# Patient Record
Sex: Male | Born: 1940 | Race: Black or African American | Hispanic: No | Marital: Married | Smoking: Never smoker
Health system: Southern US, Community
[De-identification: ages and names within clinical notes are randomized; demographics above are authoritative.]

## PROBLEM LIST (undated history)

## (undated) DIAGNOSIS — I10 Essential (primary) hypertension: Secondary | ICD-10-CM

## (undated) DIAGNOSIS — I4891 Unspecified atrial fibrillation: Secondary | ICD-10-CM

## (undated) DIAGNOSIS — N400 Enlarged prostate without lower urinary tract symptoms: Secondary | ICD-10-CM

## (undated) DIAGNOSIS — G20 Parkinson's disease: Secondary | ICD-10-CM

## (undated) DIAGNOSIS — E785 Hyperlipidemia, unspecified: Secondary | ICD-10-CM

## (undated) DIAGNOSIS — R001 Bradycardia, unspecified: Secondary | ICD-10-CM

## (undated) DIAGNOSIS — G20A1 Parkinson's disease without dyskinesia, without mention of fluctuations: Secondary | ICD-10-CM

## (undated) DIAGNOSIS — R413 Other amnesia: Secondary | ICD-10-CM

## (undated) DIAGNOSIS — F0390 Unspecified dementia without behavioral disturbance: Secondary | ICD-10-CM

## (undated) DIAGNOSIS — I959 Hypotension, unspecified: Secondary | ICD-10-CM

## (undated) HISTORY — PX: PACEMAKER PLACEMENT: SHX43

---

## 2007-10-17 ENCOUNTER — Emergency Department (HOSPITAL_COMMUNITY): Admission: EM | Admit: 2007-10-17 | Discharge: 2007-10-17 | Payer: Self-pay | Admitting: Emergency Medicine

## 2011-10-18 ENCOUNTER — Emergency Department (HOSPITAL_COMMUNITY): Payer: Medicare Other

## 2011-10-18 ENCOUNTER — Other Ambulatory Visit: Payer: Self-pay

## 2011-10-18 ENCOUNTER — Encounter (HOSPITAL_COMMUNITY): Payer: Self-pay | Admitting: Emergency Medicine

## 2011-10-18 ENCOUNTER — Emergency Department (HOSPITAL_COMMUNITY)
Admission: EM | Admit: 2011-10-18 | Discharge: 2011-10-18 | Disposition: A | Discharge status: Home / Self Care | Payer: Medicare Other | Attending: Emergency Medicine | Admitting: Emergency Medicine

## 2011-10-18 DIAGNOSIS — R109 Unspecified abdominal pain: Secondary | ICD-10-CM | POA: Insufficient documentation

## 2011-10-18 DIAGNOSIS — M545 Low back pain, unspecified: Secondary | ICD-10-CM | POA: Insufficient documentation

## 2011-10-18 DIAGNOSIS — R42 Dizziness and giddiness: Secondary | ICD-10-CM | POA: Insufficient documentation

## 2011-10-18 DIAGNOSIS — Z79899 Other long term (current) drug therapy: Secondary | ICD-10-CM | POA: Insufficient documentation

## 2011-10-18 DIAGNOSIS — E785 Hyperlipidemia, unspecified: Secondary | ICD-10-CM | POA: Insufficient documentation

## 2011-10-18 DIAGNOSIS — R05 Cough: Secondary | ICD-10-CM | POA: Insufficient documentation

## 2011-10-18 DIAGNOSIS — R5381 Other malaise: Secondary | ICD-10-CM | POA: Insufficient documentation

## 2011-10-18 DIAGNOSIS — R059 Cough, unspecified: Secondary | ICD-10-CM | POA: Insufficient documentation

## 2011-10-18 DIAGNOSIS — R531 Weakness: Secondary | ICD-10-CM

## 2011-10-18 DIAGNOSIS — I10 Essential (primary) hypertension: Secondary | ICD-10-CM | POA: Insufficient documentation

## 2011-10-18 HISTORY — DX: Hyperlipidemia, unspecified: E78.5

## 2011-10-18 HISTORY — DX: Benign prostatic hyperplasia without lower urinary tract symptoms: N40.0

## 2011-10-18 HISTORY — DX: Essential (primary) hypertension: I10

## 2011-10-18 LAB — CBC WITH DIFFERENTIAL/PLATELET
Basophils Absolute: 0 10*3/uL (ref 0.0–0.1)
Basophils Relative: 0 % (ref 0–1)
Eosinophils Absolute: 0.1 10*3/uL (ref 0.0–0.7)
Eosinophils Relative: 2 % (ref 0–5)
HCT: 37.5 % — ABNORMAL LOW (ref 39.0–52.0)
MCH: 24.8 pg — ABNORMAL LOW (ref 26.0–34.0)
MCHC: 31.2 g/dL (ref 30.0–36.0)
MCV: 79.4 fL (ref 78.0–100.0)
Monocytes Absolute: 0.5 10*3/uL (ref 0.1–1.0)
Monocytes Relative: 7 % (ref 3–12)
RDW: 13 % (ref 11.5–15.5)

## 2011-10-18 LAB — URINALYSIS, ROUTINE W REFLEX MICROSCOPIC
Bilirubin Urine: NEGATIVE
Glucose, UA: NEGATIVE mg/dL
Protein, ur: NEGATIVE mg/dL
Urobilinogen, UA: 0.2 mg/dL (ref 0.0–1.0)

## 2011-10-18 LAB — BASIC METABOLIC PANEL
BUN: 8 mg/dL (ref 6–23)
CO2: 29 mEq/L (ref 19–32)
Chloride: 102 mEq/L (ref 96–112)
Creatinine, Ser: 1.06 mg/dL (ref 0.50–1.35)
GFR calc Af Amer: 78 mL/min — ABNORMAL LOW (ref >90)
Glucose, Bld: 101 mg/dL — ABNORMAL HIGH (ref 70–99)

## 2011-10-18 LAB — URINE MICROSCOPIC-ADD ON

## 2011-10-18 MED ORDER — NAPROXEN 250 MG PO TABS
250.0000 mg | ORAL_TABLET | Freq: Once | ORAL | Status: AC
  Administered 2011-10-18: 250 mg via ORAL
  Filled 2011-10-18: qty 1

## 2011-10-18 MED ORDER — SODIUM CHLORIDE 0.9 % IV SOLN
INTRAVENOUS | Status: DC
  Administered 2011-10-18: 22:00:00 via INTRAVENOUS

## 2011-10-18 MED ORDER — POTASSIUM CHLORIDE 20 MEQ/15ML (10%) PO LIQD
40.0000 meq | Freq: Once | ORAL | Status: AC
  Administered 2011-10-18: 40 meq via ORAL
  Filled 2011-10-18: qty 30

## 2011-10-18 NOTE — ED Provider Notes (Signed)
History     CSN: [id number]  Arrival date & time 10/18/11  4098   First MD Initiated Contact with Patient 10/18/11 1819      Chief Complaint  Patient presents with  . Weakness  . Dizziness     HPI Pt was seen at 1905.  Per pt, c/o gradual onset and persistence of constant generalized weakness and fatigue and lightheadedness for the past 3 days.  States his symptoms began after he had his colonoscopy.  Pt also c/o ongoing right flank pain radiating into right lower abd "for a while now."  Pt states he has been eval by his PMD for same and was "told to take naprosyn," but does not remember the diagnosis.  Denies N/V/D, no vertigo, no syncope, no CP/palpitations, no SOB/cough, no visual changes, no facial droop, no slurred speech, no focal motor weakness, no tingling/numbness in extremities, no fevers, no black or blood in stools, no dysuria/hematuria, no testicular pain/swelling.     Past Medical History  Diagnosis Date  . BPH (benign prostatic hyperplasia)   . Hypertension   . Hyperlipemia     History reviewed. No pertinent past surgical history.   History  Substance Use Topics  . Smoking status: Not on file  . Smokeless tobacco: Not on file  . Alcohol Use: No    Review of Systems ROS: Statement: All systems negative except as marked or noted in the HPI; Constitutional: +generalized weakness/fatigue. Negative for fever and chills. ; ; Eyes: Negative for eye pain, redness and discharge. ; ; ENMT: Negative for ear pain, hoarseness, nasal congestion, sinus pressure and sore throat. ; ; Cardiovascular: Negative for chest pain, palpitations, diaphoresis, dyspnea and peripheral edema. ; ; Respiratory: Negative for cough, wheezing and stridor. ; ; Gastrointestinal: Negative for nausea, vomiting, diarrhea, abdominal pain, blood in stool, hematemesis, jaundice and rectal bleeding. . ; ; Genitourinary: Negative for dysuria, flank pain and hematuria. ; ; Genital:  No penile drainage or rash,  no testicular pain or swelling, no scrotal rash or swelling. ;; Musculoskeletal: +LBP. Negative for neck pain. Negative for swelling and trauma.; ; Skin: Negative for pruritus, rash, abrasions, blisters, bruising and skin lesion.; ; Neuro: +lightheadedness. Negative for headache and neck stiffness. Negative for weakness, altered level of consciousness , altered mental status, extremity weakness, paresthesias, involuntary movement, seizure and syncope.     Allergies  Review of patient's allergies indicates no known allergies.  Home Medications   Current Outpatient Rx  Name Route Sig Dispense Refill  . AMLODIPINE BESYLATE 5 MG PO TABS Oral Take 5 mg by mouth daily.    . ASPIRIN EC 81 MG PO TBEC Oral Take 81 mg by mouth daily.    Marland Kitchen DOXAZOSIN MESYLATE 8 MG PO TABS Oral Take 8 mg by mouth at bedtime.    Marland Kitchen FERROUS SULFATE 325 (65 FE) MG PO TABS Oral Take 325 mg by mouth daily with breakfast.    . HYDROCODONE-ACETAMINOPHEN 5-325 MG PO TABS Oral Take 1 tablet by mouth every 4 (four) hours as needed. pain    . LORATADINE 10 MG PO TABS Oral Take 10 mg by mouth daily.    Marland Kitchen LORAZEPAM 0.5 MG PO TABS Oral Take 0.5 mg by mouth daily as needed. anxiety    . SIMVASTATIN 20 MG PO TABS Oral Take 20 mg by mouth every evening.    . TORSEMIDE 10 MG PO TABS Oral Take 10 mg by mouth daily.      BP 158/72  Pulse 54  Temp 98.1 F (36.7 C) (Oral)  Resp 20  Ht 5\' 9"  (1.753 m)  Wt 250 lb (113.399 kg)  BMI 36.92 kg/m2  SpO2 98%  Physical Exam 1905: Physical examination:  Nursing notes reviewed; Vital signs and O2 SAT reviewed;  Constitutional: Well developed, Well nourished, Well hydrated, In no acute distress; Head:  Normocephalic, atraumatic; Eyes: EOMI, PERRL, No scleral icterus; ENMT: Mouth and pharynx normal, Mucous membranes moist; Neck: Supple, Full range of motion, No lymphadenopathy; Cardiovascular: Regular rate and rhythm, No gallop; Respiratory: Breath sounds clear & equal bilaterally, No wheezes.   Speaking full sentences with ease, Normal respiratory effort/excursion; Chest: Nontender, Movement normal; Abdomen: Soft, Nontender, No palp masses. Nondistended, Normal bowel sounds; Genitourinary: No CVA tenderness; Spine:  No midline CS, TS, LS tenderness.;; Extremities: Pulses normal, No tenderness, No edema, No calf edema or asymmetry.; Neuro: AA&Ox3, Major CN grossly intact.  Speech clear. No facial droop. No gross focal motor or sensory deficits in extremities.; Skin: Color normal, Warm, Dry.   ED Course  Procedures   1910:  Pt is not orthostatic.    MDM  MDM Reviewed: nursing note and vitals Interpretation: labs, ECG, x-ray and CT scan    Date: 10/18/2011  Rate: 44  Rhythm: normal sinus rhythm and sinus arrhythmia  QRS Axis: normal  Intervals: PR prolonged  ST/T Wave abnormalities: normal  Conduction Disutrbances:first-degree A-V block   Narrative Interpretation:   Old EKG Reviewed: none available.  Results for orders placed during the hospital encounter of 10/18/11  BASIC METABOLIC PANEL      Component Value Range   Sodium 139  135 - 145 mEq/L   Potassium 3.1 (*) 3.5 - 5.1 mEq/L   Chloride 102  96 - 112 mEq/L   CO2 29  19 - 32 mEq/L   Glucose, Bld 101 (*) 70 - 99 mg/dL   BUN 8  6 - 23 mg/dL   Creatinine, Ser 1.61  0.50 - 1.35 mg/dL   Calcium 9.5  8.4 - 09.6 mg/dL   GFR calc non Af Amer 68 (*) >90 mL/min   GFR calc Af Amer 78 (*) >90 mL/min  CBC WITH DIFFERENTIAL      Component Value Range   WBC 7.6  4.0 - 10.5 K/uL   RBC 4.72  4.22 - 5.81 MIL/uL   Hemoglobin 11.7 (*) 13.0 - 17.0 g/dL   HCT 04.5 (*) 40.9 - 81.1 %   MCV 79.4  78.0 - 100.0 fL   MCH 24.8 (*) 26.0 - 34.0 pg   MCHC 31.2  30.0 - 36.0 g/dL   RDW 91.4  78.2 - 95.6 %   Platelets 203  150 - 400 K/uL   Neutrophils Relative 67  43 - 77 %   Neutro Abs 5.1  1.7 - 7.7 K/uL   Lymphocytes Relative 24  12 - 46 %   Lymphs Abs 1.8  0.7 - 4.0 K/uL   Monocytes Relative 7  3 - 12 %   Monocytes Absolute 0.5  0.1  - 1.0 K/uL   Eosinophils Relative 2  0 - 5 %   Eosinophils Absolute 0.1  0.0 - 0.7 K/uL   Basophils Relative 0  0 - 1 %   Basophils Absolute 0.0  0.0 - 0.1 K/uL  TROPONIN I      Component Value Range   Troponin I <0.30  <0.30 ng/mL  URINALYSIS, ROUTINE W REFLEX MICROSCOPIC      Component Value Range   Color, Urine YELLOW  YELLOW   APPearance CLEAR  CLEAR   Specific Gravity, Urine 1.015  1.005 - 1.030   pH 7.5  5.0 - 8.0   Glucose, UA NEGATIVE  NEGATIVE mg/dL   Hgb urine dipstick TRACE (*) NEGATIVE   Bilirubin Urine NEGATIVE  NEGATIVE   Ketones, ur NEGATIVE  NEGATIVE mg/dL   Protein, ur NEGATIVE  NEGATIVE mg/dL   Urobilinogen, UA 0.2  0.0 - 1.0 mg/dL   Nitrite NEGATIVE  NEGATIVE   Leukocytes, UA NEGATIVE  NEGATIVE  URINE MICROSCOPIC-ADD ON      Component Value Range   Squamous Epithelial / LPF RARE  RARE   WBC, UA 0-2  <3 WBC/hpf   RBC / HPF 3-6  <3 RBC/hpf   Bacteria, UA RARE  RARE   Dg Chest 1 View 10/18/2011  *RADIOLOGY REPORT*  Clinical Data: Abnormal chest x-ray  CHEST - 1 VIEW  Comparison: Film from earlier in the same day  Findings: Frontal view the chest was obtained with nipple markers in place.  Previously seen density does not represent a nipple shadow but likely represents calcification in an anterior rib end. No other focal abnormality is noted.  IMPRESSION: The previously seen nodule does not represent a nipple shadow.  It does however likely represent calcification in the anterior aspect of the rib end on the right.  The need for confirmation by means of CT examination can be determined on a clinical basis.  The scheduled CT of the abdomen and pelvis likely cover this region.   Original Report Authenticated By: Phillips Odor, M.D.    Dg Chest 2 View 10/18/2011  *RADIOLOGY REPORT*  Clinical Data: 71 year old male with cough and weakness.  CHEST - 2 VIEW  Comparison: None.  Findings: Normal slightly shallow lung volumes.  Cardiac size and mediastinal contours are within  normal limits.  Visualized tracheal air column is within normal limits.  No pneumothorax, pulmonary edema, pleural effusion or confluent pulmonary opacity.  4 mm nodular density projects at the anterior right sixth rib level. This might represent a nipple shadow. No acute osseous abnormality identified.  Flowing osteophytes or syndesmophytes in the thoracic spine.  IMPRESSION: 1.  4 mm right lung base lung nodule versus nipple shadow.  Repeat PA view with nipple markers may confirm. 2.  Otherwise no acute cardiopulmonary abnormality.   Original Report Authenticated By: Harley Hallmark, M.D.    Ct Abdomen Pelvis Wo Contrast 10/18/2011  *RADIOLOGY REPORT*  Clinical Data: Right-sided flank pain.  CT ABDOMEN AND PELVIS WITHOUT CONTRAST  Technique:  Multidetector CT imaging of the abdomen and pelvis was performed following the standard protocol without intravenous contrast.  Comparison: None.  Findings: No evidence of renal calculi or hydronephrosis.  No evidence of ureteral calculi or dilatation.  No bladder calculi identified.  Mild to moderate enlargement prostate gland is seen however the bladder is nondilated.  The other abdominal parenchymal organs have a normal appearance on this noncontrast study.  Gallbladder is unremarkable.  No soft tissue masses or lymphadenopathy identified.  No evidence of inflammatory process or abnormal fluid collections.  Normal appendix is visualized.  No evidence of dilated bowel loops.  IMPRESSION:  1.  No evidence of urolithiasis, hydronephrosis, or other acute findings. 2.  Mild to moderate prostate enlargement.   Original Report Authenticated By: Danae Orleans, M.D.       2200:  Spoke with Rads MD Eppie Gibson regarding correlating CXR findings with CT scan:  CT scan did cover the area in  question on the CXR, but no corresponding abnormality is noted on the CT.   Pt's VS remain stable.  Pt has tol PO well without N/V.  Pt has ambulated in the ED with steady gait, easy resps.   Potassium repleted PO. States he is ready to go home now.  Dx testing d/w pt and family.  Questions answered.  Verb understanding, agreeable to d/c home with outpt f/u.       Laray Anger, DO 10/22/11 1707

## 2011-10-18 NOTE — ED Notes (Signed)
Pt ambulated to bathroom.  No signs of distress.

## 2011-10-18 NOTE — ED Notes (Signed)
Pt reports that having a colonoscopy done last Wednesday at [facility],  Pt reports that he has been dizzy and weak since the procedure, dizziness is worse with movement, wife reports that pt has not been eating well since Wednesday, pt also c/o lower abd pain that pt states that he has "off and on" since before the procedure.

## 2011-10-18 NOTE — ED Notes (Signed)
Patient was asked to get a urine sample. Patient stated he would try.

## 2011-10-18 NOTE — ED Notes (Signed)
Pt c/o weakness and dizziness since his colonoscopy Wed.

## 2011-10-21 LAB — URINE CULTURE
Colony Count: NO GROWTH
Culture: NO GROWTH

## 2015-05-17 ENCOUNTER — Emergency Department (HOSPITAL_COMMUNITY): Payer: Medicare Other

## 2015-05-17 ENCOUNTER — Encounter (HOSPITAL_COMMUNITY): Payer: Self-pay | Admitting: Emergency Medicine

## 2015-05-17 ENCOUNTER — Emergency Department (HOSPITAL_COMMUNITY)
Admission: EM | Admit: 2015-05-17 | Discharge: 2015-05-17 | Disposition: A | Discharge status: Home / Self Care | Payer: Medicare Other | Attending: Emergency Medicine | Admitting: Emergency Medicine

## 2015-05-17 DIAGNOSIS — R0789 Other chest pain: Secondary | ICD-10-CM | POA: Insufficient documentation

## 2015-05-17 DIAGNOSIS — Z7982 Long term (current) use of aspirin: Secondary | ICD-10-CM | POA: Insufficient documentation

## 2015-05-17 DIAGNOSIS — Z7951 Long term (current) use of inhaled steroids: Secondary | ICD-10-CM | POA: Insufficient documentation

## 2015-05-17 DIAGNOSIS — Z79899 Other long term (current) drug therapy: Secondary | ICD-10-CM | POA: Insufficient documentation

## 2015-05-17 DIAGNOSIS — I10 Essential (primary) hypertension: Secondary | ICD-10-CM | POA: Diagnosis not present

## 2015-05-17 HISTORY — DX: Hypotension, unspecified: I95.9

## 2015-05-17 LAB — BASIC METABOLIC PANEL
Anion gap: 6 (ref 5–15)
BUN: 9 mg/dL (ref 6–20)
CALCIUM: 9 mg/dL (ref 8.9–10.3)
CO2: 29 mmol/L (ref 22–32)
CREATININE: 1.11 mg/dL (ref 0.61–1.24)
Chloride: 106 mmol/L (ref 101–111)
GFR calc Af Amer: >60 mL/min (ref >60)
GLUCOSE: 106 mg/dL — AB (ref 65–99)
POTASSIUM: 4.7 mmol/L (ref 3.5–5.1)
SODIUM: 141 mmol/L (ref 135–145)

## 2015-05-17 LAB — CBC
HCT: 41 % (ref 39.0–52.0)
Hemoglobin: 13 g/dL (ref 13.0–17.0)
MCH: 25.8 pg — ABNORMAL LOW (ref 26.0–34.0)
MCHC: 31.7 g/dL (ref 30.0–36.0)
MCV: 81.3 fL (ref 78.0–100.0)
PLATELETS: 215 10*3/uL (ref 150–400)
RBC: 5.04 MIL/uL (ref 4.22–5.81)
RDW: 13 % (ref 11.5–15.5)
WBC: 6.6 10*3/uL (ref 4.0–10.5)

## 2015-05-17 LAB — TROPONIN I: Troponin I: <0.03 ng/mL (ref <0.031)

## 2015-05-17 NOTE — ED Provider Notes (Signed)
CSN: [id number]     Arrival date & time 05/17/15  1128 History   First MD Initiated Contact with Patient 05/17/15 1243     Chief Complaint  Patient presents with  . Chest Pain     (Consider location/radiation/quality/duration/timing/severity/associated sxs/prior Treatment) Patient is a 75 y.o. male presenting with chest pain. The history is provided by the patient and the spouse (According to the patient's wife the patient was hitting himself in the chest limits having a dream last night. He complains of left-sided chest pain where he spent striking himself).  Chest Pain Pain location:  L chest Pain quality: aching   Pain radiates to:  Does not radiate Pain radiates to the back: no   Pain severity:  Mild Onset quality:  Sudden Timing:  Intermittent Progression:  Waxing and waning Associated symptoms: no abdominal pain, no back pain, no cough, no fatigue and no headache     Past Medical History  Diagnosis Date  . BPH (benign prostatic hyperplasia)   . Hypertension   . Hyperlipemia   . Hypotension    Past Surgical History  Procedure Laterality Date  . Pacemaker placement     No family history on file. Social History  Substance Use Topics  . Smoking status: Never Smoker   . Smokeless tobacco: None  . Alcohol Use: No    Review of Systems  Constitutional: Negative for appetite change and fatigue.  HENT: Negative for congestion, ear discharge and sinus pressure.   Eyes: Negative for discharge.  Respiratory: Negative for cough.   Cardiovascular: Positive for chest pain.  Gastrointestinal: Negative for abdominal pain and diarrhea.  Genitourinary: Negative for frequency and hematuria.  Musculoskeletal: Negative for back pain.  Skin: Negative for rash.  Neurological: Negative for seizures and headaches.  Psychiatric/Behavioral: Negative for hallucinations.      Allergies  Review of patient's allergies indicates no known allergies.  Home Medications   Prior to  Admission medications   Medication Sig Start Date End Date Taking? Authorizing Provider  aspirin EC 81 MG tablet Take 81 mg by mouth daily.   Yes Historical Provider, MD  fluticasone Aleda Grana(FLONASE) 50 MCG/ACT nasal spray  07/29/10  Yes Historical Provider, MD  amLODipine (NORVASC) 5 MG tablet Take 5 mg by mouth daily.    Historical Provider, MD  doxazosin (CARDURA) 8 MG tablet Take 8 mg by mouth at bedtime.    Historical Provider, MD  ferrous sulfate 325 (65 FE) MG tablet Take 325 mg by mouth daily with breakfast.    Historical Provider, MD  HYDROcodone-acetaminophen (NORCO/VICODIN) 5-325 MG per tablet Take 1 tablet by mouth every 4 (four) hours as needed. pain    Historical Provider, MD  loratadine (CLARITIN) 10 MG tablet Take 10 mg by mouth daily.    Historical Provider, MD  LORazepam (ATIVAN) 0.5 MG tablet Take 0.5 mg by mouth daily as needed. anxiety    Historical Provider, MD  simvastatin (ZOCOR) 20 MG tablet Take 20 mg by mouth every evening.    Historical Provider, MD  torsemide (DEMADEX) 10 MG tablet Take 10 mg by mouth daily.    Historical Provider, MD   BP 145/74 mmHg  Pulse 60  Temp(Src) 97.6 F (36.4 C) (Oral)  Resp 16  Ht 5\' 11"  (1.803 m)  Wt 246 lb (111.585 kg)  BMI 34.33 kg/m2  SpO2 100% Physical Exam  Constitutional: He is oriented to person, place, and time. He appears well-developed.  HENT:  Head: Normocephalic.  Eyes: Conjunctivae and EOM are  normal. No scleral icterus.  Neck: Neck supple. No thyromegaly present.  Cardiovascular: Normal rate and regular rhythm.  Exam reveals no gallop and no friction rub.   No murmur heard. Tender left side chest mild  Pulmonary/Chest: No stridor. He has no wheezes. He has no rales. He exhibits no tenderness.  Abdominal: He exhibits no distension. There is no tenderness. There is no rebound.  Musculoskeletal: Normal range of motion. He exhibits no edema.  Lymphadenopathy:    He has no cervical adenopathy.  Neurological: He is oriented  to person, place, and time. He exhibits normal muscle tone. Coordination normal.  Skin: No rash noted. No erythema.  Psychiatric: He has a normal mood and affect. His behavior is normal.    ED Course  Procedures (including critical care time) Labs Review Labs Reviewed  BASIC METABOLIC PANEL - Abnormal; Notable for the following:    Glucose, Bld 106 (*)    All other components within normal limits  CBC - Abnormal; Notable for the following:    MCH 25.8 (*)    All other components within normal limits  TROPONIN I  TROPONIN I    Imaging Review Dg Chest 2 View  05/17/2015  CLINICAL DATA:  Central chest pain for 1 day EXAM: CHEST  2 VIEW COMPARISON:  10/18/2011 FINDINGS: Cardiac shadow is stable. A pacing device is now seen. The lungs are clear bilaterally. Mild degenerative changes of thoracic spine are noted. IMPRESSION: No active cardiopulmonary disease. Electronically Signed   By: Alcide Clever M.D.   On: 05/17/2015 12:13   I have personally reviewed and evaluated these images and lab results as part of my medical decision-making.   EKG Interpretation   Date/Time:  Thursday May 17 2015 11:38:34 EDT Ventricular Rate:  66 PR Interval:  256 QRS Duration: 78 QT Interval:  394 QTC Calculation: 413 R Axis:   55 Text Interpretation:  Atrial-paced rhythm with prolonged AV conduction  Anteroseptal infarct , age undetermined Abnormal ECG Confirmed by Dajiah Kooi   MD, Toneisha Savary [phone]) on 05/17/2015 1:17:02 PM      MDM   Final diagnoses:  Anterior chest wall pain    Labs including 2 troponins were negative. EKG unremarkable. Patient will be discharged home with diagnosis of chest wall tenderness. He'll follow-up with his PCP as needed    Bethann Berkshire, MD 05/17/15 1643

## 2015-05-17 NOTE — ED Notes (Signed)
MD at bedside. 

## 2015-05-17 NOTE — ED Notes (Signed)
Pt reports sudden onset of central CP that began this morning. Pt reports after CP began he felt dizzy. State CP has resolved at this time, but continues to feel dizzy. Pt has a pacemaker.

## 2015-05-17 NOTE — ED Notes (Signed)
Pt denies any CP at this time. Pt states he is dizzy but this has been going on for months.

## 2015-05-17 NOTE — Discharge Instructions (Signed)
Follow up with your md if problems °

## 2016-06-03 ENCOUNTER — Other Ambulatory Visit (HOSPITAL_COMMUNITY): Payer: Self-pay | Admitting: Neurology

## 2016-06-03 DIAGNOSIS — R413 Other amnesia: Secondary | ICD-10-CM

## 2016-06-17 ENCOUNTER — Ambulatory Visit (HOSPITAL_COMMUNITY)
Admission: RE | Admit: 2016-06-17 | Discharge: 2016-06-17 | Disposition: A | Discharge status: Home / Self Care | Payer: Medicare Other | Source: Ambulatory Visit | Attending: Neurology | Admitting: Neurology

## 2016-06-17 DIAGNOSIS — F0390 Unspecified dementia without behavioral disturbance: Secondary | ICD-10-CM | POA: Diagnosis present

## 2016-06-17 DIAGNOSIS — G319 Degenerative disease of nervous system, unspecified: Secondary | ICD-10-CM | POA: Insufficient documentation

## 2016-06-17 DIAGNOSIS — R413 Other amnesia: Secondary | ICD-10-CM

## 2018-08-10 ENCOUNTER — Other Ambulatory Visit: Payer: Self-pay | Admitting: Neurology

## 2018-08-10 ENCOUNTER — Other Ambulatory Visit (HOSPITAL_COMMUNITY): Payer: Self-pay | Admitting: Neurology

## 2018-08-10 DIAGNOSIS — G4452 New daily persistent headache (NDPH): Secondary | ICD-10-CM

## 2018-08-25 ENCOUNTER — Ambulatory Visit (HOSPITAL_COMMUNITY): Payer: Medicare Other

## 2018-09-10 ENCOUNTER — Other Ambulatory Visit: Payer: Self-pay

## 2018-09-10 ENCOUNTER — Ambulatory Visit (HOSPITAL_COMMUNITY)
Admission: RE | Admit: 2018-09-10 | Discharge: 2018-09-10 | Disposition: A | Discharge status: Home / Self Care | Payer: Medicare Other | Source: Ambulatory Visit | Attending: Neurology | Admitting: Neurology

## 2018-09-10 DIAGNOSIS — G4452 New daily persistent headache (NDPH): Secondary | ICD-10-CM | POA: Insufficient documentation

## 2018-10-29 ENCOUNTER — Emergency Department (HOSPITAL_COMMUNITY): Payer: Medicare Other

## 2018-10-29 ENCOUNTER — Other Ambulatory Visit: Payer: Self-pay

## 2018-10-29 ENCOUNTER — Encounter (HOSPITAL_COMMUNITY): Payer: Self-pay

## 2018-10-29 ENCOUNTER — Inpatient Hospital Stay (HOSPITAL_COMMUNITY)
Admission: EM | Admit: 2018-10-29 | Discharge: 2018-11-01 | DRG: 948 | Disposition: A | Discharge status: Skilled Nursing Facility | Payer: Medicare Other | Attending: Internal Medicine | Admitting: Internal Medicine

## 2018-10-29 DIAGNOSIS — R001 Bradycardia, unspecified: Secondary | ICD-10-CM | POA: Diagnosis not present

## 2018-10-29 DIAGNOSIS — F0390 Unspecified dementia without behavioral disturbance: Secondary | ICD-10-CM | POA: Diagnosis not present

## 2018-10-29 DIAGNOSIS — I495 Sick sinus syndrome: Secondary | ICD-10-CM | POA: Diagnosis present

## 2018-10-29 DIAGNOSIS — G20A1 Parkinson's disease without dyskinesia, without mention of fluctuations: Secondary | ICD-10-CM

## 2018-10-29 DIAGNOSIS — G20 Parkinson's disease: Secondary | ICD-10-CM | POA: Diagnosis present

## 2018-10-29 DIAGNOSIS — Z950 Presence of cardiac pacemaker: Secondary | ICD-10-CM

## 2018-10-29 DIAGNOSIS — R5381 Other malaise: Secondary | ICD-10-CM | POA: Diagnosis not present

## 2018-10-29 DIAGNOSIS — E039 Hypothyroidism, unspecified: Secondary | ICD-10-CM | POA: Diagnosis present

## 2018-10-29 DIAGNOSIS — R531 Weakness: Principal | ICD-10-CM | POA: Diagnosis present

## 2018-10-29 DIAGNOSIS — I482 Chronic atrial fibrillation, unspecified: Secondary | ICD-10-CM | POA: Diagnosis present

## 2018-10-29 DIAGNOSIS — N400 Enlarged prostate without lower urinary tract symptoms: Secondary | ICD-10-CM | POA: Diagnosis present

## 2018-10-29 DIAGNOSIS — Z7982 Long term (current) use of aspirin: Secondary | ICD-10-CM

## 2018-10-29 DIAGNOSIS — Z7901 Long term (current) use of anticoagulants: Secondary | ICD-10-CM

## 2018-10-29 DIAGNOSIS — I480 Paroxysmal atrial fibrillation: Secondary | ICD-10-CM | POA: Diagnosis present

## 2018-10-29 DIAGNOSIS — E785 Hyperlipidemia, unspecified: Secondary | ICD-10-CM | POA: Diagnosis present

## 2018-10-29 DIAGNOSIS — N179 Acute kidney failure, unspecified: Secondary | ICD-10-CM | POA: Diagnosis present

## 2018-10-29 DIAGNOSIS — Z79899 Other long term (current) drug therapy: Secondary | ICD-10-CM

## 2018-10-29 DIAGNOSIS — G309 Alzheimer's disease, unspecified: Secondary | ICD-10-CM | POA: Diagnosis present

## 2018-10-29 DIAGNOSIS — F0281 Dementia in other diseases classified elsewhere with behavioral disturbance: Secondary | ICD-10-CM | POA: Diagnosis present

## 2018-10-29 DIAGNOSIS — Z7983 Long term (current) use of bisphosphonates: Secondary | ICD-10-CM

## 2018-10-29 DIAGNOSIS — I10 Essential (primary) hypertension: Secondary | ICD-10-CM | POA: Diagnosis present

## 2018-10-29 DIAGNOSIS — Z20828 Contact with and (suspected) exposure to other viral communicable diseases: Secondary | ICD-10-CM | POA: Diagnosis present

## 2018-10-29 HISTORY — DX: Other amnesia: R41.3

## 2018-10-29 HISTORY — DX: Bradycardia, unspecified: R00.1

## 2018-10-29 LAB — CBC WITH DIFFERENTIAL/PLATELET
Abs Immature Granulocytes: 0.02 10*3/uL (ref 0.00–0.07)
Basophils Absolute: 0 10*3/uL (ref 0.0–0.1)
Basophils Relative: 0 %
Eosinophils Absolute: 0.1 10*3/uL (ref 0.0–0.5)
Eosinophils Relative: 1 %
HCT: 44.3 % (ref 39.0–52.0)
Hemoglobin: 13 g/dL (ref 13.0–17.0)
Immature Granulocytes: 0 %
Lymphocytes Relative: 15 %
Lymphs Abs: 1.4 10*3/uL (ref 0.7–4.0)
MCH: 24.4 pg — ABNORMAL LOW (ref 26.0–34.0)
MCHC: 29.3 g/dL — ABNORMAL LOW (ref 30.0–36.0)
MCV: 83.3 fL (ref 80.0–100.0)
Monocytes Absolute: 0.9 10*3/uL (ref 0.1–1.0)
Monocytes Relative: 10 %
Neutro Abs: 6.7 10*3/uL (ref 1.7–7.7)
Neutrophils Relative %: 74 %
Platelets: 223 10*3/uL (ref 150–400)
RBC: 5.32 MIL/uL (ref 4.22–5.81)
RDW: 13.8 % (ref 11.5–15.5)
WBC: 9.1 10*3/uL (ref 4.0–10.5)
nRBC: 0 % (ref 0.0–0.2)

## 2018-10-29 LAB — COMPREHENSIVE METABOLIC PANEL
ALT: 33 U/L (ref 0–44)
AST: 110 U/L — ABNORMAL HIGH (ref 15–41)
Albumin: 4 g/dL (ref 3.5–5.0)
Alkaline Phosphatase: 75 U/L (ref 38–126)
Anion gap: 8 (ref 5–15)
BUN: 17 mg/dL (ref 8–23)
CO2: 25 mmol/L (ref 22–32)
Calcium: 8.8 mg/dL — ABNORMAL LOW (ref 8.9–10.3)
Chloride: 103 mmol/L (ref 98–111)
Creatinine, Ser: 1.49 mg/dL — ABNORMAL HIGH (ref 0.61–1.24)
GFR calc Af Amer: 51 mL/min — ABNORMAL LOW (ref >60)
GFR calc non Af Amer: 44 mL/min — ABNORMAL LOW (ref >60)
Glucose, Bld: 85 mg/dL (ref 70–99)
Potassium: 3.8 mmol/L (ref 3.5–5.1)
Sodium: 136 mmol/L (ref 135–145)
Total Bilirubin: 0.6 mg/dL (ref 0.3–1.2)
Total Protein: 7.5 g/dL (ref 6.5–8.1)

## 2018-10-29 LAB — URINALYSIS, ROUTINE W REFLEX MICROSCOPIC
Bacteria, UA: NONE SEEN
Bilirubin Urine: NEGATIVE
Glucose, UA: NEGATIVE mg/dL
Ketones, ur: 5 mg/dL — AB
Leukocytes,Ua: NEGATIVE
Nitrite: NEGATIVE
Protein, ur: 30 mg/dL — AB
Specific Gravity, Urine: 1.025 (ref 1.005–1.030)
pH: 5 (ref 5.0–8.0)

## 2018-10-29 LAB — TROPONIN I (HIGH SENSITIVITY)
Troponin I (High Sensitivity): 13 ng/L (ref <18)
Troponin I (High Sensitivity): 12 ng/L (ref <18)

## 2018-10-29 LAB — MAGNESIUM: Magnesium: 2.3 mg/dL (ref 1.7–2.4)

## 2018-10-29 LAB — BRAIN NATRIURETIC PEPTIDE: B Natriuretic Peptide: 101 pg/mL — ABNORMAL HIGH (ref 0.0–100.0)

## 2018-10-29 MED ORDER — SODIUM CHLORIDE 0.9 % IV SOLN
INTRAVENOUS | Status: DC
  Administered 2018-10-29: 19:00:00 via INTRAVENOUS

## 2018-10-29 MED ORDER — POTASSIUM CHLORIDE CRYS ER 20 MEQ PO TBCR
40.0000 meq | EXTENDED_RELEASE_TABLET | Freq: Once | ORAL | Status: AC
  Administered 2018-10-29: 22:00:00 40 meq via ORAL
  Filled 2018-10-29: qty 2

## 2018-10-29 NOTE — ED Triage Notes (Signed)
EMS reports pts wife reports pt has had generalized weakness and increased confusion since Monday.  Also reports increased swelling in ankles.  Pt says wife reports pt usually ambulatory.

## 2018-10-29 NOTE — ED Notes (Signed)
Patient unable to stand.  Spouse stated that for last 2 weeks patient has been unable to get up and down without her assisting him.  Patient stating he felt "uneasy about standing".

## 2018-10-29 NOTE — H&P (Signed)
TRH H&P   Patient Demographics:    Charles Stanley, is a 78 y.o. male  MRN: [id number]   DOB - 11/21/1940  Admit Date - 10/29/2018  Outpatient Primary Stanley for the patient is Charles Stanley  Referring Stanley/NP/PA: Dr Charles Stanley  Patient coming from: Home  Chief Complaint  Patient presents with  . Weakness      HPI:    Charles Stanley  is a 78 y.o. male, with past medical history of bradycardia, status post permanent pacemaker, BPH, hyperlipidemia, hypertension, dementia, and recent diagnosis of Parkinson disease, patient was brought to ED by his wife secondary to progressive generalized weakness, wife reports patient was diagnosed with Parkinson, not started on any treatment, reports that he has been using a walker for few months now, but he has a progressive decline, recently he was not even able to stand up secondary to unsteady gait, patient himself denies any focal deficits or weakness, but report generalized weakness, he is poor historian, most of the history was obtained from his wife, as well patient has poor appetite, there is no slurred speech, focal deficits, visual problem, loss of consciousness, syncope or near syncope, no fever, no chills, no chest pain or shortness of breath. -In ED CT head with no acute findings, no significant labs abnormalities, he was noted to have some mild pitting edema otherwise nothing focal.    Review of systems:    In addition to the HPI above, No Fever-chills, No Headache, No changes with Vision or hearing, No problems swallowing food or Liquids, but patient is with very poor appetite No Chest pain, Cough or Shortness of Breath, No Abdominal pain, No Nausea or Vommitting, Bowel movements are regular, No Blood in stool or Urine, No dysuria, No new skin rashes or bruises, No new joints pains-aches,  Patient is with generalized  weakness, unsteady gait, progressive over few weeks, but no focal deficits No recent weight gain or loss, No polyuria, polydypsia or polyphagia, No significant Mental Stressors.  A full 10 point Review of Systems was done, except as stated above, all other Review of Systems were negative.   With Past History of the following :    Past Medical History:  Diagnosis Date  . BPH (benign prostatic hyperplasia)   . Bradycardia   . Hyperlipemia   . Hypertension   . Hypotension   . Memory impairment       Past Surgical History:  Procedure Laterality Date  . PACEMAKER PLACEMENT        Social History:     Social History   Tobacco Use  . Smoking status: Never Smoker  Substance Use Topics  . Alcohol use: No        Family History :   Family history was reviewed, no pertinent history  Home Medications:   Prior to Admission medications   Medication Sig Start Date End Date  Taking? Authorizing Provider  apixaban (ELIQUIS) 5 MG TABS tablet Take 1 tablet by mouth 2 (two) times daily. 03/15/18  Yes Provider, Historical, Stanley  aspirin EC 81 MG tablet Take 81 mg by mouth daily.   Yes Provider, Historical, Stanley  donepezil (ARICEPT) 10 MG tablet Take 1 tablet by mouth daily.   Yes Provider, Historical, Stanley  levothyroxine (SYNTHROID) 50 MCG tablet Take 1 tablet by mouth daily. 12/13/17  Yes Provider, Historical, Stanley  loratadine (CLARITIN) 10 MG tablet Take 10 mg by mouth daily.   Yes Provider, Historical, Stanley  LORazepam (ATIVAN) 0.5 MG tablet Take 0.5 mg by mouth daily as needed. anxiety   Yes Provider, Historical, Stanley  losartan (COZAAR) 100 MG tablet Take 1 tablet by mouth daily. 12/04/17  Yes Provider, Historical, Stanley  Memantine HCl-Donepezil HCl (NAMZARIC) 28-10 MG CP24 Take 1 capsule by mouth daily. 08/04/17  Yes Provider, Historical, Stanley  Potassium Chloride ER 20 MEQ TBCR Take 1 tablet by mouth 2 (two) times daily.   Yes Provider, Historical, Stanley  sertraline (ZOLOFT) 100 MG tablet Take 1 tablet  by mouth daily. 12/14/17  Yes Provider, Historical, Stanley  silodosin (RAPAFLO) 8 MG CAPS capsule Take 1 capsule by mouth daily. 11/30/17  Yes Provider, Historical, Stanley     Allergies:    No Known Allergies   Physical Exam:   Vitals  Blood pressure (!) 131/94, pulse (!) 59, temperature 98.1 F (36.7 C), temperature source Oral, resp. rate 14, weight 111 kg, SpO2 100 %.   1. General frail, elderly male, laying in bed in no apparent distress  2.  Patient is awake alert x3, but slow to respond, with some baseline dementia.  3. No F.N deficits, ALL C.Nerves Intact, overall no focal deficits could be appreciated, but he has profound generalized weakness, Sensation intact all 4 extremities, Plantars down going.  4. Ears and Eyes appear Normal, Conjunctivae clear, PERRLA. Moist Oral Mucosa.  5. Supple Neck, No JVD, No cervical lymphadenopathy appriciated, No Carotid Bruits.  6. Symmetrical Chest wall movement, Good air movement bilaterally, CTAB.  7. RRR, No Gallops, Rubs or Murmurs, No Parasternal Heave.  1 edema  8. Positive Bowel Sounds, Abdomen Soft, No tenderness, No organomegaly appriciated,No rebound -guarding or rigidity.  9.  No Cyanosis, Normal Skin Turgor, No Skin Rash or Bruise.  10. Good muscle tone,  joints appear normal , no effusions, patient with some tremors.  11. No Palpable Lymph Nodes in Neck or Axillae    Data Review:    CBC Recent Labs  Lab 10/29/18 1734  WBC 9.1  HGB 13.0  HCT 44.3  PLT 223  MCV 83.3  MCH 24.4*  MCHC 29.3*  RDW 13.8  LYMPHSABS 1.4  MONOABS 0.9  EOSABS 0.1  BASOSABS 0.0   ------------------------------------------------------------------------------------------------------------------  Chemistries  Recent Labs  Lab 10/29/18 1734  NA 136  K 3.8  CL 103  CO2 25  GLUCOSE 85  BUN 17  CREATININE 1.49*  CALCIUM 8.8*  AST 110*  ALT 33  ALKPHOS 75  BILITOT 0.6    ------------------------------------------------------------------------------------------------------------------ CrCl cannot be calculated (Unknown ideal weight.). ------------------------------------------------------------------------------------------------------------------ No results for input(s): TSH, T4TOTAL, T3FREE, THYROIDAB in the last 72 hours.  Invalid input(s): FREET3  Coagulation profile No results for input(s): INR, PROTIME in the last 168 hours. ------------------------------------------------------------------------------------------------------------------- No results for input(s): DDIMER in the last 72 hours. -------------------------------------------------------------------------------------------------------------------  Cardiac Enzymes No results for input(s): CKMB, TROPONINI, MYOGLOBIN in the last 168 hours.  Invalid input(s): CK ------------------------------------------------------------------------------------------------------------------  Component Value Date/Time   BNP 101.0 (H) 10/29/2018 1907     ---------------------------------------------------------------------------------------------------------------  Urinalysis    Component Value Date/Time   COLORURINE YELLOW 10/29/2018 1840   APPEARANCEUR HAZY (A) 10/29/2018 1840   LABSPEC 1.025 10/29/2018 1840   PHURINE 5.0 10/29/2018 1840   GLUCOSEU NEGATIVE 10/29/2018 1840   HGBUR SMALL (A) 10/29/2018 1840   BILIRUBINUR NEGATIVE 10/29/2018 1840   KETONESUR 5 (A) 10/29/2018 1840   PROTEINUR 30 (A) 10/29/2018 1840   UROBILINOGEN 0.2 10/18/2011 1925   NITRITE NEGATIVE 10/29/2018 1840   LEUKOCYTESUR NEGATIVE 10/29/2018 1840    ----------------------------------------------------------------------------------------------------------------   Imaging Results:    Dg Chest 2 View  Result Date: 10/29/2018 CLINICAL DATA:  Weakness EXAM: CHEST - 2 VIEW COMPARISON:  05/17/2015 FINDINGS: There is no  focal consolidation. There is no pleural effusion or pneumothorax. The heart and mediastinal contours are unremarkable. There is a dual lead cardiac pacemaker. There is no acute osseous abnormality. IMPRESSION: No active cardiopulmonary disease. Electronically Signed   By: Kathreen Devoid   On: 10/29/2018 18:27   Ct Head Wo Contrast  Result Date: 10/29/2018 CLINICAL DATA:  Generalized muscle weakness. EXAM: CT HEAD WITHOUT CONTRAST TECHNIQUE: Contiguous axial images were obtained from the base of the skull through the vertex without intravenous contrast. COMPARISON:  08/23/2018 FINDINGS: Brain: There is no evidence of acute infarct, intracranial hemorrhage, mass, midline shift, or extra-axial fluid collection. Mild cerebral atrophy is unchanged and within normal limits for age. Vascular: No hyperdense vessel. Skull: No fracture or focal osseous lesion. Sinuses/Orbits: Visualized paranasal sinuses and mastoid air cells are clear. Bilateral cataract extraction is noted. Other: None. IMPRESSION: Unremarkable CT appearance of the brain for age. Electronically Signed   By: Logan Bores M.D.   On: 10/29/2018 18:09   EKG showing normal sinus rhythm at 64 bpm, with 574 QTC   Assessment & Plan:    Active Problems:   Generalized weakness   Physical deconditioning   Dementia without behavioral disturbance (HCC)   Parkinson disease (HCC)   Bradycardia   Generalized weakness/deconditioning -Patient with progressive weakness over last few weeks, but with rapid decline over the last week, this is multifactorial, most likely in the setting of advanced dementia, and Parkinson disease, untreated, CT head with no acute findings, patient had no focal deficits, will consult PT/OT, as likely will need some home health on discharge including PT/OT/RN and aide,. -Per wife patient was recently diagnosed with Parkinson disease, he is not started on any treatment, I have discussed with her to follow with neurology as an  outpatient next week to initiate some treatment. -As well some progressive when she appears to be contributing.  Paroxysmal A. Fib -Continue with Eliquis for anticoagulation, currently normal sinus rhythm  Prolonged QTC -QTC prolonged at 574, will hold his Zoloft, will monitor on telemetry, potassium 3.8, will give 29 M EQ to keep potassium more than 4, will check magnesium level as well  Dementia -Continue with home medication  History of bradycardia -Status post permanent pacemaker  BPH -Continue with home meds  Hypertension -Continue with home medication   DVT Prophylaxis : on Eliquis  AM Labs Ordered, also please review Full Orders  Family Communication: Admission, patients condition and plan of care including tests being ordered have been discussed with the patient and Wife who indicate understanding and agree with the plan and Code Status.  Code Status Full  Likely DC to Home  Condition GUARDED    Consults called: None  Admission status:  Observation  Time spent in minutes : 50 minutes   Huey Bienenstock M.D on 10/29/2018 at 9:41 PM  Between 7am to 7pm - Pager - [phone]. After 7pm go to [email or web address] - password [facility]  [facility] - Office  [phone]

## 2018-10-29 NOTE — ED Provider Notes (Signed)
[facility] EMERGENCY DEPARTMENT Provider Note   CSN: [id number] Arrival date & time: 10/29/18  1702     History   Chief Complaint Chief Complaint  Patient presents with  . Weakness    HPI Charles Stanley is a 78 y.o. male.     The history is provided by the spouse, the EMS personnel and the patient. The history is limited by the condition of the patient (Hx dementia).  Weakness   Pt was seen at 1705. Per EMS and pt's family: Pt with increasing generalized weakness for the past 1 week. Pt also "more confused" than his usual baseline dementia, and "not eating/drinking well." Pt now unable to stand or walk due to his weakness (pt ambulatory per baseline, per wife). Pt himself denies any complaints. Denies abd pain, no N/V/D, no CP/SOB, no cough, no fevers, no focal motor weakness.    Past Medical History:  Diagnosis Date  . BPH (benign prostatic hyperplasia)   . Bradycardia   . Hyperlipemia   . Hypertension   . Hypotension   . Memory impairment     There are no active problems to display for this patient.   Past Surgical History:  Procedure Laterality Date  . PACEMAKER PLACEMENT          Home Medications    Prior to Admission medications   Medication Sig Start Date End Date Taking? Authorizing Provider  apixaban (ELIQUIS) 5 MG TABS tablet Take 1 tablet by mouth 2 (two) times daily. 03/15/18  Yes [provider]  aspirin EC 81 MG tablet Take 81 mg by mouth daily.   Yes [provider]  donepezil (ARICEPT) 10 MG tablet Take 1 tablet by mouth daily.   Yes [provider]  levothyroxine (SYNTHROID) 50 MCG tablet Take 1 tablet by mouth daily. 12/13/17  Yes [provider]  loratadine (CLARITIN) 10 MG tablet Take 10 mg by mouth daily.   Yes [provider]  LORazepam (ATIVAN) 0.5 MG tablet Take 0.5 mg by mouth daily as needed. anxiety   Yes [provider]  losartan (COZAAR) 100 MG tablet Take 1 tablet by mouth  daily. 12/04/17  Yes [provider]  Memantine HCl-Donepezil HCl (NAMZARIC) 28-10 MG CP24 Take 1 capsule by mouth daily. 08/04/17  Yes [provider]  Potassium Chloride ER 20 MEQ TBCR Take 1 tablet by mouth 2 (two) times daily.   Yes [provider]  sertraline (ZOLOFT) 100 MG tablet Take 1 tablet by mouth daily. 12/14/17  Yes [provider]  silodosin (RAPAFLO) 8 MG CAPS capsule Take 1 capsule by mouth daily. 11/30/17  Yes [provider]    Family History No family history on file.  Social History Social History   Tobacco Use  . Smoking status: Never Smoker  Substance Use Topics  . Alcohol use: No  . Drug use: No     Allergies   Patient has no known allergies.   Review of Systems Review of Systems  Unable to perform ROS: Dementia  Neurological: Positive for weakness.     Physical Exam Updated Vital Signs BP 115/80   Pulse 60   Temp 98.1 F (36.7 C) (Oral)   Resp 16   Wt 111 kg   SpO2 100%   BMI 34.13 kg/m   18:19 Orthostatic Vital Signs VP  Orthostatic Lying   BP- Lying: 128/75  Pulse- Lying: 61      Orthostatic Sitting  BP- Sitting: 115/80  Pulse- Sitting: 61  Pt unable to stand   Physical Exam 1710: Physical examination:  Nursing notes reviewed; Vital signs and O2 SAT reviewed;  Constitutional: Well developed, Well nourished, In no acute distress; Head:  Normocephalic, atraumatic; Eyes: EOMI, PERRL, No scleral icterus; ENMT: Mouth and pharynx normal, Mucous membranes dry; Neck: Supple, Full range of motion, No lymphadenopathy; Cardiovascular: Regular rate and rhythm, No gallop; Respiratory: Breath sounds clear & equal bilaterally, No wheezes.  Speaking full sentences with ease, Normal respiratory effort/excursion; Chest: Nontender, Movement normal; Abdomen: Soft, Nontender, Nondistended, Normal bowel sounds; Genitourinary: No CVA tenderness; Extremities: Peripheral pulses normal, No tenderness, +2 pedal  edema feet to ankles bilat..; Neuro: Awake/alert, confused per hx dementia. No facial droop. Speech clear. No gross focal motor deficits in extremities.; Skin: Color normal, Warm, Dry.     ED Treatments / Results  Labs (all labs ordered are listed, but only abnormal results are displayed)   EKG EKG Interpretation  Date/Time:  Friday October 29 2018 17:15:43 EDT Ventricular Rate:  64 PR Interval:    QRS Duration: 75 QT Interval:  556 QTC Calculation: 574 R Axis:   49 Text Interpretation:  Poor data quality Normal sinus rhythm Anteroseptal infarct, old Nonspecific T abnormalities, lateral leads Prolonged QT interval Baseline wander When compared with ECG of 05/17/2015 Nonspecific T wave abnormality is now present Confirmed by Samuel JesterMcManus, Ariyon Gerstenberger [phone]([id number]) on 10/29/2018 6:10:53 PM   Radiology   Procedures Procedures (including critical care time)  Medications Ordered in ED Medications  0.9 %  sodium chloride infusion (has no administration in time range)     Initial Impression / Assessment and Plan / ED Course  I have reviewed the triage vital signs and the nursing notes.  Pertinent labs & imaging results that were available during my care of the patient were reviewed by me and considered in my medical decision making (see chart for details).    MDM Reviewed: previous chart, nursing note and vitals Reviewed previous: labs and ECG Interpretation: labs, ECG, x-ray and CT scan   Results for orders placed or performed during the hospital encounter of 10/29/18  Comprehensive metabolic panel  Result Value Ref Range   Sodium 136 135 - 145 mmol/L   Potassium 3.8 3.5 - 5.1 mmol/L   Chloride 103 98 - 111 mmol/L   CO2 25 22 - 32 mmol/L   Glucose, Bld 85 70 - 99 mg/dL   BUN 17 8 - 23 mg/dL   Creatinine, Ser 4.781.49 (H) 0.61 - 1.24 mg/dL   Calcium 8.8 (L) 8.9 - 10.3 mg/dL   Total Protein 7.5 6.5 - 8.1 g/dL   Albumin 4.0 3.5 - 5.0 g/dL   AST 295110 (H) 15 - 41 U/L   ALT 33 0 - 44 U/L    Alkaline Phosphatase 75 38 - 126 U/L   Total Bilirubin 0.6 0.3 - 1.2 mg/dL   GFR calc non Af Amer 44 (L) >60 mL/min   GFR calc Af Amer 51 (L) >60 mL/min   Anion gap 8 5 - 15  CBC with Differential  Result Value Ref Range   WBC 9.1 4.0 - 10.5 K/uL   RBC 5.32 4.22 - 5.81 MIL/uL   Hemoglobin 13.0 13.0 - 17.0 g/dL   HCT 62.144.3 30.839.0 - 65.752.0 %   MCV 83.3 80.0 - 100.0 fL   MCH 24.4 (L) 26.0 - 34.0 pg   MCHC 29.3 (L) 30.0 - 36.0 g/dL   RDW 84.613.8 96.211.5 - 95.215.5 %   Platelets 223 150 - 400  K/uL   nRBC 0.0 0.0 - 0.2 %   Neutrophils Relative % 74 %   Neutro Abs 6.7 1.7 - 7.7 K/uL   Lymphocytes Relative 15 %   Lymphs Abs 1.4 0.7 - 4.0 K/uL   Monocytes Relative 10 %   Monocytes Absolute 0.9 0.1 - 1.0 K/uL   Eosinophils Relative 1 %   Eosinophils Absolute 0.1 0.0 - 0.5 K/uL   Basophils Relative 0 %   Basophils Absolute 0.0 0.0 - 0.1 K/uL   Immature Granulocytes 0 %   Abs Immature Granulocytes 0.02 0.00 - 0.07 K/uL  Urinalysis, Routine w reflex microscopic  Result Value Ref Range   Color, Urine YELLOW YELLOW   APPearance HAZY (A) CLEAR   Specific Gravity, Urine 1.025 1.005 - 1.030   pH 5.0 5.0 - 8.0   Glucose, UA NEGATIVE NEGATIVE mg/dL   Hgb urine dipstick SMALL (A) NEGATIVE   Bilirubin Urine NEGATIVE NEGATIVE   Ketones, ur 5 (A) NEGATIVE mg/dL   Protein, ur 30 (A) NEGATIVE mg/dL   Nitrite NEGATIVE NEGATIVE   Leukocytes,Ua NEGATIVE NEGATIVE   RBC / HPF 0-5 0 - 5 RBC/hpf   WBC, UA 0-5 0 - 5 WBC/hpf   Bacteria, UA NONE SEEN NONE SEEN   Squamous Epithelial / LPF 0-5 0 - 5   Mucus PRESENT    Hyaline Casts, UA PRESENT   Troponin I (High Sensitivity)  Result Value Ref Range   Troponin I (High Sensitivity) 13 <18 ng/L   Dg Chest 2 View Result Date: 10/29/2018 CLINICAL DATA:  Weakness EXAM: CHEST - 2 VIEW COMPARISON:  05/17/2015 FINDINGS: There is no focal consolidation. There is no pleural effusion or pneumothorax. The heart and mediastinal contours are unremarkable. There is a dual lead  cardiac pacemaker. There is no acute osseous abnormality. IMPRESSION: No active cardiopulmonary disease. Electronically Signed   By: Elige Ko   On: 10/29/2018 18:27   Ct Head Wo Contrast Result Date: 10/29/2018 CLINICAL DATA:  Generalized muscle weakness. EXAM: CT HEAD WITHOUT CONTRAST TECHNIQUE: Contiguous axial images were obtained from the base of the skull through the vertex without intravenous contrast. COMPARISON:  08/23/2018 FINDINGS: Brain: There is no evidence of acute infarct, intracranial hemorrhage, mass, midline shift, or extra-axial fluid collection. Mild cerebral atrophy is unchanged and within normal limits for age. Vascular: No hyperdense vessel. Skull: No fracture or focal osseous lesion. Sinuses/Orbits: Visualized paranasal sinuses and mastoid air cells are clear. Bilateral cataract extraction is noted. Other: None. IMPRESSION: Unremarkable CT appearance of the brain for age. Electronically Signed   By: Sebastian Ache M.D.   On: 10/29/2018 18:09    Charles Stanley was evaluated in Emergency Department on 10/29/2018 for the symptoms described in the history of present illness. He was evaluated in the context of the global COVID-19 pandemic, which necessitated consideration that the patient might be at risk for infection with the SARS-CoV-2 virus that causes COVID-19. Institutional protocols and algorithms that pertain to the evaluation of patients at risk for COVID-19 are in a state of rapid change based on information released by regulatory bodies including the CDC and federal and state organizations. These policies and algorithms were followed during the patient's care in the ED.   1940:  Pt unable to stand for orthostatic VS due to generalized weakness. BUN/Cr elevated from baseline; judicious IVF given. T/C returned from [facility]. Randol Kern, case discussed, including:  HPI, pertinent PM/SHx, VS/PE, dx testing, ED course and treatment:  Agreeable to admit.  Final Clinical  Impressions(s) / ED Diagnoses   Final diagnoses:  None    ED Discharge Orders    None       Francine Graven, DO 10/31/18 1810

## 2018-10-30 ENCOUNTER — Other Ambulatory Visit: Payer: Self-pay

## 2018-10-30 ENCOUNTER — Encounter (HOSPITAL_COMMUNITY): Payer: Self-pay

## 2018-10-30 DIAGNOSIS — R5381 Other malaise: Secondary | ICD-10-CM | POA: Diagnosis not present

## 2018-10-30 DIAGNOSIS — R531 Weakness: Secondary | ICD-10-CM | POA: Diagnosis not present

## 2018-10-30 DIAGNOSIS — N179 Acute kidney failure, unspecified: Secondary | ICD-10-CM | POA: Diagnosis not present

## 2018-10-30 DIAGNOSIS — G309 Alzheimer's disease, unspecified: Secondary | ICD-10-CM

## 2018-10-30 DIAGNOSIS — F0281 Dementia in other diseases classified elsewhere with behavioral disturbance: Secondary | ICD-10-CM

## 2018-10-30 DIAGNOSIS — G20 Parkinson's disease: Secondary | ICD-10-CM

## 2018-10-30 DIAGNOSIS — I482 Chronic atrial fibrillation, unspecified: Secondary | ICD-10-CM

## 2018-10-30 LAB — CBC
HCT: 42.7 % (ref 39.0–52.0)
Hemoglobin: 12.7 g/dL — ABNORMAL LOW (ref 13.0–17.0)
MCH: 24.9 pg — ABNORMAL LOW (ref 26.0–34.0)
MCHC: 29.7 g/dL — ABNORMAL LOW (ref 30.0–36.0)
MCV: 83.6 fL (ref 80.0–100.0)
Platelets: 205 10*3/uL (ref 150–400)
RBC: 5.11 MIL/uL (ref 4.22–5.81)
RDW: 13.4 % (ref 11.5–15.5)
WBC: 9.2 10*3/uL (ref 4.0–10.5)
nRBC: 0 % (ref 0.0–0.2)

## 2018-10-30 LAB — SARS CORONAVIRUS 2 (TAT 6-24 HRS): SARS Coronavirus 2: NEGATIVE

## 2018-10-30 LAB — BASIC METABOLIC PANEL
Anion gap: 9 (ref 5–15)
BUN: 17 mg/dL (ref 8–23)
CO2: 24 mmol/L (ref 22–32)
Calcium: 9 mg/dL (ref 8.9–10.3)
Chloride: 107 mmol/L (ref 98–111)
Creatinine, Ser: 1.2 mg/dL (ref 0.61–1.24)
GFR calc Af Amer: >60 mL/min (ref >60)
GFR calc non Af Amer: 57 mL/min — ABNORMAL LOW (ref >60)
Glucose, Bld: 81 mg/dL (ref 70–99)
Potassium: 3.8 mmol/L (ref 3.5–5.1)
Sodium: 140 mmol/L (ref 135–145)

## 2018-10-30 MED ORDER — LORAZEPAM 0.5 MG PO TABS: 0.5000 mg | ORAL_TABLET | Freq: Every day | ORAL | Status: DC | PRN

## 2018-10-30 MED ORDER — LORATADINE 10 MG PO TABS
10.0000 mg | ORAL_TABLET | Freq: Every day | ORAL | Status: DC
  Administered 2018-10-30 – 2018-11-01 (×3): 10 mg via ORAL
  Filled 2018-10-30 (×3): qty 1

## 2018-10-30 MED ORDER — MEMANTINE HCL ER 28 MG PO CP24
28.0000 mg | ORAL_CAPSULE | Freq: Every day | ORAL | Status: DC
  Administered 2018-10-30 – 2018-11-01 (×3): 28 mg via ORAL
  Filled 2018-10-30 (×5): qty 1

## 2018-10-30 MED ORDER — DONEPEZIL HCL 5 MG PO TABS
10.0000 mg | ORAL_TABLET | Freq: Every day | ORAL | Status: DC
  Administered 2018-10-30 – 2018-11-01 (×3): 10 mg via ORAL
  Filled 2018-10-30 (×4): qty 2

## 2018-10-30 MED ORDER — ACETAMINOPHEN 325 MG PO TABS
650.0000 mg | ORAL_TABLET | Freq: Four times a day (QID) | ORAL | Status: DC | PRN
  Administered 2018-10-30: 650 mg via ORAL
  Filled 2018-10-30: qty 2

## 2018-10-30 MED ORDER — TAMSULOSIN HCL 0.4 MG PO CAPS
0.4000 mg | ORAL_CAPSULE | Freq: Every day | ORAL | Status: DC
  Administered 2018-10-30 – 2018-10-31 (×2): 0.4 mg via ORAL
  Filled 2018-10-30 (×2): qty 1

## 2018-10-30 MED ORDER — POTASSIUM CHLORIDE ER 20 MEQ PO TBCR: 1.0000 | EXTENDED_RELEASE_TABLET | Freq: Every day | ORAL | Status: DC

## 2018-10-30 MED ORDER — ACETAMINOPHEN 650 MG RE SUPP: 650.0000 mg | Freq: Four times a day (QID) | RECTAL | Status: DC | PRN

## 2018-10-30 MED ORDER — MEMANTINE HCL-DONEPEZIL HCL ER 28-10 MG PO CP24: 1.0000 | ORAL_CAPSULE | Freq: Every day | ORAL | Status: DC

## 2018-10-30 MED ORDER — POTASSIUM CHLORIDE CRYS ER 20 MEQ PO TBCR
20.0000 meq | EXTENDED_RELEASE_TABLET | Freq: Two times a day (BID) | ORAL | Status: DC
  Administered 2018-10-30 – 2018-11-01 (×5): 20 meq via ORAL
  Filled 2018-10-30 (×6): qty 1

## 2018-10-30 MED ORDER — LOSARTAN POTASSIUM 50 MG PO TABS
100.0000 mg | ORAL_TABLET | Freq: Every day | ORAL | Status: DC
  Administered 2018-10-30 – 2018-11-01 (×3): 100 mg via ORAL
  Filled 2018-10-30 (×4): qty 2

## 2018-10-30 MED ORDER — ASPIRIN EC 81 MG PO TBEC
81.0000 mg | DELAYED_RELEASE_TABLET | Freq: Every day | ORAL | Status: DC
  Administered 2018-10-30 – 2018-11-01 (×3): 81 mg via ORAL
  Filled 2018-10-30 (×3): qty 1

## 2018-10-30 MED ORDER — APIXABAN 5 MG PO TABS
5.0000 mg | ORAL_TABLET | Freq: Two times a day (BID) | ORAL | Status: DC
  Administered 2018-10-30 – 2018-11-01 (×5): 5 mg via ORAL
  Filled 2018-10-30 (×6): qty 1

## 2018-10-30 MED ORDER — LEVOTHYROXINE SODIUM 50 MCG PO TABS
50.0000 ug | ORAL_TABLET | Freq: Every day | ORAL | Status: DC
  Administered 2018-10-31 – 2018-11-01 (×2): 50 ug via ORAL
  Filled 2018-10-30 (×2): qty 1

## 2018-10-30 NOTE — Evaluation (Signed)
Physical Therapy Evaluation Patient Details Name: Charles Stanley MRN: [id number] DOB: 1940-05-25 Today's Date: 10/30/2018   History of Present Illness  Patient is am 17 year lod male admitted to the hospital 10/30/2018 with diagnosis of generalized weakness. PMH: bradycardia, status post permanent pacemaker, BPH, hyperlipidemia, hypertension, dementia, and recent diagnosis of Parkinson disease. wife reports patient was diagnosed with Parkinson, not started on any treatment, reports that he has been using a walker for few months now, but he has a progressive decline, recently he was not even able to stand up secondary to unsteady gait.    Clinical Impression  Pt admitted with above diagnosis. Patient difficult to arouse but would open eyes when first name spoken to him. Wife present throughout session. Wife provided history and present level information. Patient required max assist for bed mobility, to maintain sitting and for sit-to-stand. Ambulation and stand/pivot transfers not performed today due to safety concerns and patient was found to be soiled upon standing. At first wife wanted to take patient home and continue to try to take care of him with addition of [place] services. PT explained the likely extent of [facility] services, explained the amount of physical care needed for patient and caregiver load. Wife requested PT speak with her daughter on the phone. PT reiterated previously mentioned information and recommendation for SNF placement. Wife and daughter verbalized agreement and phone call was ended. Pt currently with functional limitations due to the deficits listed below (see PT Problem List). Pt will benefit from skilled PT to increase their independence and safety with mobility to allow discharge to the venue listed below.       Follow Up Recommendations SNF;Supervision/Assistance - 24 hour    Equipment Recommendations  None recommended by PT    Recommendations for Other Services        Precautions / Restrictions Precautions Precautions: Fall Restrictions Weight Bearing Restrictions: No      Mobility  Bed Mobility Overal bed mobility: Needs Assistance Bed Mobility: Rolling;Sidelying to Sit;Sit to Sidelying Rolling: Max assist Sidelying to sit: Max assist     Sit to sidelying: Max assist    Transfers Overall transfer level: Needs assistance Equipment used: 1 person hand held assist Transfers: Sit to/from Stand Sit to Stand: Max assist            Ambulation/Gait             General Gait Details: did not attempt due to safety concerns and patient was soiled upon standing  Stairs            Wheelchair Mobility    Modified Rankin (Stroke Patients Only)       Balance Overall balance assessment: Needs assistance Sitting-balance support: No upper extremity supported;Feet supported Sitting balance-Leahy Scale: Poor   Postural control: Left lateral lean;Posterior lean Standing balance support: No upper extremity supported;During functional activity Standing balance-Leahy Scale: Zero                               Pertinent Vitals/Pain Pain Assessment: Faces(non-verbal, flat affect)    Home Living Family/patient expects to be discharged to:: Private residence Living Arrangements: Spouse/significant other(80 year old niece) Available Help at Discharge: Family;Available 24 hours/day Type of Home: House Home Access: Stairs to enter Entrance Stairs-Rails: None Entrance Stairs-Number of Steps: 1 Home Layout: Two level;Able to live on main level with bedroom/bathroom Home Equipment: Gilford Rile - 2 wheels;Shower seat;Bedside commode;Cane - single point;Grab bars -  tub/shower;Hand held shower head      Prior Function Level of Independence: Needs assistance   Gait / Transfers Assistance Needed: began walking with RW prior to admission but still required assistance from wife to stand  ADL's / Homemaking Assistance Needed:  assistance for all  Comments: progression of Parkinson's, weakness and increase in assistance needed.     Hand Dominance   Dominant Hand: Right    Extremity/Trunk Assessment   Upper Extremity Assessment Upper Extremity Assessment: Generalized weakness    Lower Extremity Assessment Lower Extremity Assessment: Generalized weakness       Communication   Communication: Expressive difficulties  Cognition Arousal/Alertness: Lethargic Behavior During Therapy: Flat affect Overall Cognitive Status: Impaired/Different from baseline                                 General Comments: wife reports progressive decline within recent months.      General Comments      Exercises     Assessment/Plan    PT Assessment Patient needs continued PT services  PT Problem List Decreased strength;Decreased mobility;Decreased activity tolerance;Decreased cognition;Decreased balance;Decreased knowledge of use of DME       PT Treatment Interventions DME instruction;Therapeutic activities;Gait training;Patient/family education;Therapeutic exercise;Balance training    PT Goals (Current goals can be found in the Care Plan section)  Acute Rehab PT Goals Patient Stated Goal: Go home. PT Goal Formulation: With family Time For Goal Achievement: 11/13/18 Potential to Achieve Goals: Fair    Frequency Min 2X/week   Barriers to discharge Inaccessible home environment;Decreased caregiver support 1-3 steps to enter home. Patient unable to stand without maximal support. Patient requires max assist for all bed mobility and transfers.    Co-evaluation               AM-PAC PT "6 Clicks" Mobility  Outcome Measure Help needed turning from your back to your side while in a flat bed without using bedrails?: Total Help needed moving from lying on your back to sitting on the side of a flat bed without using bedrails?: Total Help needed moving to and from a bed to a chair (including a  wheelchair)?: Total Help needed standing up from a chair using your arms (e.g., wheelchair or bedside chair)?: Total Help needed to walk in hospital room?: Total Help needed climbing 3-5 steps with a railing? : Total 6 Click Score: 6    End of Session Equipment Utilized During Treatment: Gait belt Activity Tolerance: Patient limited by lethargy Patient left: in bed;with family/visitor present;with call bell/phone within reach Nurse Communication: Mobility status PT Visit Diagnosis: Unsteadiness on feet (R26.81);Other abnormalities of gait and mobility (R26.89);Muscle weakness (generalized) (M62.81)    Time: 1725-1800 PT Time Calculation (min) (ACUTE ONLY): 35 min   Charges:   PT Evaluation $PT Eval Moderate Complexity: 1 Mod PT Treatments $Therapeutic Activity: 23-37 mins       Katina DungBarbara D. Hartnett-Rands, MS, PT [facility] [id number] 10/30/2018, 6:03 PM

## 2018-10-30 NOTE — Progress Notes (Signed)
Patient seen by PT this evening. Recommended SNF placement and discussed recommendations with patient, wife and daughter. Wife verbalized concerns about taking him home and is agreeable to SNF placement. Notified Dr. Dyann Kief. Order received to cancel discharge and have CSW consulted for placement. Donavan Foil, RN

## 2018-10-30 NOTE — Progress Notes (Signed)
Patient has imminent discharge for this afternoon. PT consult to be completed prior to discharge and home health RN and PT to be arranged by CM. Notified Pamala Hurry with PT that MD would like evaluation completed today. States she will see patient this afternoon. MD stated OT consult not necessary prior to discharge since they are unsure if covered by insurance. Patient and his wife at bedside are aware of pending PT evaluation before discharge. Donavan Foil, RN

## 2018-10-30 NOTE — Discharge Summary (Addendum)
Physician Discharge Summary  Charles Stanley:[id number] DOB: March 17, 1940 DOA: 10/29/2018  PCP: Stoney Bang, MD  Admit date: 10/29/2018 Discharge date: 11/01/2018  Time spent: 35 minutes  Recommendations for Outpatient Follow-up:  1. Repeat basic metabolic panel to follow electrolytes and renal function. 2. Reassess blood pressure and further adjust antihypertensive regimen as needed. 3. Would recommend goals of care discussion and advance directive.   Discharge Diagnoses:  Active Problems:   Generalized weakness   Physical deconditioning   Dementia without behavioral disturbance (HCC)   Parkinson disease (HCC)   Bradycardia   Debility   AKI (acute kidney injury) (HCC)   Atrial fibrillation, chronic   Alzheimer's dementia with behavioral disturbance (Charles Stanley)   Discharge Condition: Stable and improved.  Patient discharged to SNF for further care and rehabilitation.  Diet recommendation: Heart healthy diet.  Filed Weights   10/29/18 1709 10/30/18 0549  Weight: 111 kg 91.6 kg    History of present illness:  As per H&P written by Dr. Waldron Labs on 10/29/2018 78 y.o. male, with past medical history of bradycardia, status post permanent pacemaker, BPH, hyperlipidemia, hypertension, dementia, and recent diagnosis of Parkinson disease, patient was brought to ED by his wife secondary to progressive generalized weakness, wife reports patient was diagnosed with Parkinson, not started on any treatment, reports that he has been using a walker for few months now, but he has a progressive decline, recently he was not even able to stand up secondary to unsteady gait, patient himself denies any focal deficits or weakness, but report generalized weakness, he is poor historian, most of the history was obtained from his wife, as well patient has poor appetite, there is no slurred speech, focal deficits, visual problem, loss of consciousness, syncope or near syncope, no fever, no chills, no  chest pain or shortness of breath. In ED CT head with no acute findings, no significant labs abnormalities, he was noted to have some mild pitting edema otherwise nothing focal.  Hospital Course:  1-generalized weakness/deconditioning -Appears to be progressive weakness over the last couple of weeks in the setting of advanced dementia, decreased oral intake and Parkinson disease. -Will discharge to skilled nursing facility for further care and rehabilitation.   -Patient's family has been instructed to make sure he maintained adequate hydration and oral nutrition. -Patient has an outpatient follow-up with neurology next week where he will be further evaluated with plan to initiate treatment for Parkinson disease. -CT head demonstrated no acute intracranial abnormalities.  2-acute kidney injury in the setting of poor oral intake -Improved and back to baseline with fluid resuscitation -Patient has been advised to maintain adequate hydration.  3-paroxysmal atrial fibrillation/tachybradycardia syndrome -Status post pacemaker implantation -Continue Eliquis for secondary prevention. -Rate controlled  4-dementia with behavioral disturbances -Continue Namenda and Aricept -Continue the use of Zoloft and as needed Ativan.  5-hypothyroidism -Continue Synthroid.  6-history of BPH -Continue the use of Rapaflo -No complaints of urinary retention appreciated.  7-essential hypertension -Continue the use of Cozaar  Procedures:  See below for x-ray reports.  Consultations:  None  Discharge Exam: Vitals:   10/30/18 0549 10/30/18 0748  BP: (!) 150/76   Pulse: (!) 58   Resp: 19   Temp: 98 F (36.7 C)   SpO2: 100% 99%    General: Afebrile, no chest pain, no shortness of breath, no nausea, no vomiting.  Patient was oriented to person only.  Able to follow commands. Cardiovascular: Rate controlled, no rubs, no gallops, no JVD. Respiratory: Good air  movement bilaterally, no wheezing, no  crackles. Extremities: No edema, no cyanosis, no clubbing. Abdomen: Soft, nontender, nondistended, positive bowel sounds.  Discharge Instructions   Discharge Instructions    Diet - low sodium heart healthy   Complete by: As directed    Discharge instructions   Complete by: As directed    Take medications as prescribed Maintain adequate hydration Emphasize and assure good oral nutrition Arrange follow-up with PCP in 10 days Follow home health services staff instructions to facilitate care and rehabilitation.     Allergies as of 10/30/2018   No Known Allergies     Medication List    TAKE these medications   aspirin EC 81 MG tablet Take 81 mg by mouth daily.   donepezil 10 MG tablet Commonly known as: ARICEPT Take 1 tablet by mouth daily.   Eliquis 5 MG Tabs tablet Generic drug: apixaban Take 1 tablet by mouth 2 (two) times daily.   levothyroxine 50 MCG tablet Commonly known as: SYNTHROID Take 1 tablet by mouth daily.   loratadine 10 MG tablet Commonly known as: CLARITIN Take 10 mg by mouth daily.   LORazepam 0.5 MG tablet Commonly known as: ATIVAN Take 0.5 mg by mouth daily as needed. anxiety   losartan 100 MG tablet Commonly known as: COZAAR Take 1 tablet by mouth daily.   Namzaric 28-10 MG Cp24 Generic drug: Memantine HCl-Donepezil HCl Take 1 capsule by mouth daily.   Potassium Chloride ER 20 MEQ Tbcr Take 1 tablet by mouth daily. What changed: when to take this   sertraline 100 MG tablet Commonly known as: ZOLOFT Take 1 tablet by mouth daily.   silodosin 8 MG Caps capsule Commonly known as: RAPAFLO Take 1 capsule by mouth daily.      No Known Allergies Follow-up Information    Crowder, Charles Pelt, MD. Schedule an appointment as soon as possible for a visit in 10 day(s).   Specialty: Family Medicine Contact information: [address] [place] [address] [phone]           The results of significant diagnostics  from this hospitalization (including imaging, microbiology, ancillary and laboratory) are listed below for reference.    Significant Diagnostic Studies: Dg Chest 2 View  Result Date: 10/29/2018 CLINICAL DATA:  Weakness EXAM: CHEST - 2 VIEW COMPARISON:  05/17/2015 FINDINGS: There is no focal consolidation. There is no pleural effusion or pneumothorax. The heart and mediastinal contours are unremarkable. There is a dual lead cardiac pacemaker. There is no acute osseous abnormality. IMPRESSION: No active cardiopulmonary disease. Electronically Signed   By: Elige Ko   On: 10/29/2018 18:27   Ct Head Wo Contrast  Result Date: 10/29/2018 CLINICAL DATA:  Generalized muscle weakness. EXAM: CT HEAD WITHOUT CONTRAST TECHNIQUE: Contiguous axial images were obtained from the base of the skull through the vertex without intravenous contrast. COMPARISON:  08/23/2018 FINDINGS: Brain: There is no evidence of acute infarct, intracranial hemorrhage, mass, midline shift, or extra-axial fluid collection. Mild cerebral atrophy is unchanged and within normal limits for age. Vascular: No hyperdense vessel. Skull: No fracture or focal osseous lesion. Sinuses/Orbits: Visualized paranasal sinuses and mastoid air cells are clear. Bilateral cataract extraction is noted. Other: None. IMPRESSION: Unremarkable CT appearance of the brain for age. Electronically Signed   By: Sebastian Ache M.D.   On: 10/29/2018 18:09    Microbiology: No results found for this or any previous visit (from the past 240 hour(s)).   Labs: Basic Metabolic Panel: Recent Labs  Lab 10/29/18 1734 10/29/18 1907 10/30/18 0618  NA 136  --  140  K 3.8  --  3.8  CL 103  --  107  CO2 25  --  24  GLUCOSE 85  --  81  BUN 17  --  17  CREATININE 1.49*  --  1.20  CALCIUM 8.8*  --  9.0  MG  --  2.3  --    Liver Function Tests: Recent Labs  Lab 10/29/18 1734  AST 110*  ALT 33  ALKPHOS 75  BILITOT 0.6  PROT 7.5  ALBUMIN 4.0   CBC: Recent Labs   Lab 10/29/18 1734 10/30/18 0618  WBC 9.1 9.2  NEUTROABS 6.7  --   HGB 13.0 12.7*  HCT 44.3 42.7  MCV 83.3 83.6  PLT 223 205    BNP (last 3 results) Recent Labs    10/29/18 1907  BNP 101.0*    Signed:  Vassie Lollarlos Hawk Mones MD.  [facility] 10/30/2018, 12:54 PM

## 2018-10-30 NOTE — TOC Transition Note (Signed)
Transition of Care [facility]) - CM/SW Discharge Note   Patient Details  Name: Charles Stanley MRN: [id number] Date of Birth: 07-11-1940  Transition of Care [facility]) CM/SW Contact:  Shelbie Hutching, RN Phone Number: 10/30/2018, 4:19 PM   Clinical Narrative:     Patient is ready for discharge today, patient will discharge home with wife.  Patient and wife are agreeable to home health services.  Wife has uses [facility] in the past and would like to use them again.  Floydene Flock with [facility] given the referral.   Patient lives with his wife and 72 year old niece, patient has 2 other daughter's.   No other discharge needs identified at this time.   Final next level of care: Home w Home Health Services Barriers to Discharge: Barriers Resolved   Patient Goals and CMS Choice   CMS Medicare.gov Compare Post Acute Care list provided to:: Patient Represenative (must comment) Choice offered to / list presented to : Spouse  Discharge Placement                       Discharge Plan and Services   Discharge Planning Services: CM Consult Post Acute Care Choice: Home Health                    HH Arranged: RN, PT, OT, Nurse's Aide, Social Work [employer] Agency: [place] ([place]) Date [place]: 10/30/18 Time [place]: 1618 Representative spoke with at [place]: [place] (SDOH) Interventions     Readmission Risk Interventions No flowsheet data found.

## 2018-10-30 NOTE — Plan of Care (Signed)
  Problem: Acute Rehab PT Goals(only PT should resolve) Goal: Pt will Roll Supine to Side Outcome: Progressing Goal: Pt Will Go Supine/Side To Sit Outcome: Progressing Goal: Pt Will Go Sit To Supine/Side Outcome: Progressing Goal: Patient Will Perform Sitting Balance Outcome: Progressing Goal: Patient Will Transfer Sit To/From Stand Outcome: Progressing   [name]. Hartnett-Rands, MS, PT Per [place] [phone] 10/30/2018

## 2018-10-30 NOTE — Progress Notes (Signed)
CSW received a call from [facility] CN stating pt needs HH services with DME.  CSW spoke to [facility] RN CM at [phone] who is reviewing and will follow for HH/DME needs.  CN updated.  CSW will continue to follow for D/C needs.  Justin Buechner F. Reylynn Vanalstine, LCSW, LCAS, CSI [facility] ED CSW Transitions of Care Clinical Social Worker Care Coordination Department Ph: [phone]  

## 2018-10-30 NOTE — Progress Notes (Signed)
Nursing has attempted to reach on-call CM/CSW regarding home health arrangements for imminent discharge today. Awaiting return call from on-call CM/CSW. PT evaluation to be completed this afternoon and MD notified that we are awaiting that and home health to be arranged. Donavan Foil, RN

## 2018-10-31 DIAGNOSIS — I480 Paroxysmal atrial fibrillation: Secondary | ICD-10-CM | POA: Diagnosis present

## 2018-10-31 DIAGNOSIS — Z7983 Long term (current) use of bisphosphonates: Secondary | ICD-10-CM | POA: Diagnosis not present

## 2018-10-31 DIAGNOSIS — I495 Sick sinus syndrome: Secondary | ICD-10-CM | POA: Diagnosis present

## 2018-10-31 DIAGNOSIS — E785 Hyperlipidemia, unspecified: Secondary | ICD-10-CM | POA: Diagnosis present

## 2018-10-31 DIAGNOSIS — Z950 Presence of cardiac pacemaker: Secondary | ICD-10-CM | POA: Diagnosis not present

## 2018-10-31 DIAGNOSIS — N400 Enlarged prostate without lower urinary tract symptoms: Secondary | ICD-10-CM | POA: Diagnosis present

## 2018-10-31 DIAGNOSIS — N179 Acute kidney failure, unspecified: Secondary | ICD-10-CM | POA: Diagnosis present

## 2018-10-31 DIAGNOSIS — F0281 Dementia in other diseases classified elsewhere with behavioral disturbance: Secondary | ICD-10-CM | POA: Diagnosis present

## 2018-10-31 DIAGNOSIS — Z7901 Long term (current) use of anticoagulants: Secondary | ICD-10-CM | POA: Diagnosis not present

## 2018-10-31 DIAGNOSIS — Z79899 Other long term (current) drug therapy: Secondary | ICD-10-CM | POA: Diagnosis not present

## 2018-10-31 DIAGNOSIS — Z7982 Long term (current) use of aspirin: Secondary | ICD-10-CM | POA: Diagnosis not present

## 2018-10-31 DIAGNOSIS — R531 Weakness: Secondary | ICD-10-CM | POA: Diagnosis present

## 2018-10-31 DIAGNOSIS — I482 Chronic atrial fibrillation, unspecified: Secondary | ICD-10-CM | POA: Diagnosis present

## 2018-10-31 DIAGNOSIS — G20 Parkinson's disease: Secondary | ICD-10-CM | POA: Diagnosis present

## 2018-10-31 DIAGNOSIS — Z20828 Contact with and (suspected) exposure to other viral communicable diseases: Secondary | ICD-10-CM | POA: Diagnosis present

## 2018-10-31 DIAGNOSIS — E039 Hypothyroidism, unspecified: Secondary | ICD-10-CM | POA: Diagnosis present

## 2018-10-31 DIAGNOSIS — I10 Essential (primary) hypertension: Secondary | ICD-10-CM | POA: Diagnosis present

## 2018-10-31 DIAGNOSIS — G309 Alzheimer's disease, unspecified: Secondary | ICD-10-CM | POA: Diagnosis present

## 2018-10-31 LAB — TSH: TSH: 3.398 u[IU]/mL (ref 0.350–4.500)

## 2018-10-31 LAB — URINE CULTURE: Culture: 30000 — AB

## 2018-10-31 LAB — VITAMIN B12: Vitamin B-12: 289 pg/mL (ref 180–914)

## 2018-10-31 NOTE — Progress Notes (Signed)
PROGRESS NOTE    Charles Stanley  QIW:[id number] DOB: 1940/06/28 DOA: 10/29/2018 PCP: Manfred Arch, MD    Brief Narrative:  78 y.o.male,with past medical history of bradycardia, status post permanent pacemaker, BPH, hyperlipidemia, hypertension, dementia, and recent diagnosis of Parkinson disease, patient was brought to ED by his wife secondary to progressive generalized weakness, wife reports patient was diagnosed with Parkinson, not started on any treatment, reports that he has been using a walker for few months now, but he has a progressive decline, recently he was not even able to stand up secondary to unsteady gait, patient himself denies any focal deficits or weakness, but report generalized weakness, he is poor historian, most of the history was obtained from his wife, as well patient has poor appetite, there is no slurred speech, focal deficits, visual problem, loss of consciousness, syncope or near syncope, no fever, no chills, no chest pain or shortness of breath. In ED CT head with no acute findings, no significant labs abnormalities, he was noted to have some mild pitting edema otherwise nothing focal.  Assessment & Plan: 1-generalized weakness/deconditioning -Appears to be progressive weakness over the last couple of weeks in the setting of advanced dementia, decreased oral intake and Parkinson disease. -Will discharge to skilled nursing facility for further care and rehabilitation.   -Patient's family has been instructed to make sure he maintained adequate hydration and oral nutrition. -Patient has an outpatient follow-up with neurology next week where he will be further evaluated with plan to initiate treatment for Parkinson disease. -CT head demonstrated no acute intracranial abnormalities.  2-acute kidney injury in the setting of poor oral intake -Improved and back to baseline with fluid resuscitation -Patient has been advised to maintain adequate hydration.  -Continue to follow renal function trend intermittently.  3-paroxysmal atrial fibrillation/tachybradycardia syndrome -Status post pacemaker implantation -Continue Eliquis for secondary prevention. -Rate controlled  4-dementia with behavioral disturbances -Continue Namenda and Aricept -Continue the use of Zoloft and as needed Ativan.  5-hypothyroidism -Continue Synthroid.  6-history of BPH -Continue the use of Rapaflo -No complaints of urinary retention appreciated.  7-essential hypertension -Continue the use of Cozaar -Blood pressure stable.   DVT prophylaxis: Chronically on Eliquis Code Status: Full code Family Communication: No family at bedside.  (Daughter updated on plan of care on 10/30/2018). Disposition Plan: After physical therapy evaluation recommendations given for skilled nursing facility and family now in agreement to pursuit placement.  Awaiting insurance authorization and nursing home bed availability.  Consultants:   None  Procedures:   See below for x-ray reports  Antimicrobials:  Anti-infectives (From admission, onward)   None       Subjective: Fever, no chest pain, no shortness of breath.  Patient is frail, weak and deconditioned on examination.  Objective: Vitals:   10/30/18 0748 10/30/18 1419 10/30/18 2002 10/31/18 0515  BP:  (!) 146/73  114/65  Pulse:  61  62  Resp:  17 18 18   Temp:  98 F (36.7 C) 98 F (36.7 C) 98.1 F (36.7 C)  TempSrc:  Oral Oral Oral  SpO2: 99% 99% 99% 96%  Weight:   91.6 kg   Height:        Intake/Output Summary (Last 24 hours) at 10/31/2018 0947 Last data filed at 10/31/2018 4174 Gross per 24 hour  Intake -  Output 850 ml  Net -850 ml   Filed Weights   10/29/18 1709 10/30/18 0549 10/30/18 2002  Weight: 111 kg 91.6 kg 91.6 kg  Examination: General exam: Alert, awake, oriented x 1; in no acute distress, frail, weak and deconditioned.  No fever.  Denies any pain. Respiratory system: Clear to  auscultation. Respiratory effort normal. Cardiovascular system:RRR. No murmurs, rubs, gallops. Gastrointestinal system: Abdomen is nondistended, soft and nontender. No organomegaly or masses felt. Normal bowel sounds heard. Central nervous system: Alert and oriented. No focal neurological deficits. Extremities: No C/C/E, +pedal pulses Skin: No rashes, lesions or ulcers Psychiatry: Judgement and insight appear normal. Mood & affect appropriate.     Data Reviewed: I have personally reviewed following labs and imaging studies  CBC: Recent Labs  Lab 10/29/18 1734 10/30/18 0618  WBC 9.1 9.2  NEUTROABS 6.7  --   HGB 13.0 12.7*  HCT 44.3 42.7  MCV 83.3 83.6  PLT 223 983   Basic Metabolic Panel: Recent Labs  Lab 10/29/18 1734 10/29/18 1907 10/30/18 0618  NA 136  --  140  K 3.8  --  3.8  CL 103  --  107  CO2 25  --  24  GLUCOSE 85  --  81  BUN 17  --  17  CREATININE 1.49*  --  1.20  CALCIUM 8.8*  --  9.0  MG  --  2.3  --    GFR: Estimated Creatinine Clearance: 54 mL/min (by C-G formula based on SCr of 1.2 mg/dL).   Liver Function Tests: Recent Labs  Lab 10/29/18 1734  AST 110*  ALT 33  ALKPHOS 75  BILITOT 0.6  PROT 7.5  ALBUMIN 4.0   Urine analysis:    Component Value Date/Time   COLORURINE YELLOW 10/29/2018 1840   APPEARANCEUR HAZY (A) 10/29/2018 1840   LABSPEC 1.025 10/29/2018 1840   PHURINE 5.0 10/29/2018 1840   GLUCOSEU NEGATIVE 10/29/2018 1840   HGBUR SMALL (A) 10/29/2018 1840   BILIRUBINUR NEGATIVE 10/29/2018 1840   KETONESUR 5 (A) 10/29/2018 1840   PROTEINUR 30 (A) 10/29/2018 1840   UROBILINOGEN 0.2 10/18/2011 1925   NITRITE NEGATIVE 10/29/2018 1840   LEUKOCYTESUR NEGATIVE 10/29/2018 1840    Recent Results (from the past 240 hour(s))  Urine culture     Status: Abnormal   Collection Time: 10/29/18  6:40 PM   Specimen: Urine, Clean Catch  Result Value Ref Range Status   Specimen Description   Final    URINE, CLEAN CATCH Performed at [facility], [address] [place], [place] [address]    Special Requests   Final    NONE Performed at [facility], [address], [place], [place] [address]    Culture (A)  Final    30,000 COLONIES/mL MULTIPLE SPECIES PRESENT, SUGGEST RECOLLECTION   Report Status 10/31/2018 FINAL  Final  SARS CORONAVIRUS 2 (TAT 6-24 HRS) Nasopharyngeal Nasopharyngeal Swab     Status: None   Collection Time: 10/29/18  7:51 PM   Specimen: Nasopharyngeal Swab  Result Value Ref Range Status   SARS Coronavirus 2 NEGATIVE NEGATIVE Final    Comment: (NOTE) SARS-CoV-2 target nucleic acids are NOT DETECTED. The SARS-CoV-2 RNA is generally detectable in upper and lower respiratory specimens during the acute phase of infection. Negative results do not preclude SARS-CoV-2 infection, do not rule out co-infections with other pathogens, and should not be used as the sole basis for treatment or other patient management decisions. Negative results must be combined with clinical observations, patient history, and epidemiological information. The expected result is Negative. Fact Sheet for Patients: SugarRoll.be Fact Sheet for Healthcare Providers: https://www.woods-mathews.com/ This test is not yet approved  or cleared by the [place] FDA and  has been authorized for detection and/or diagnosis of SARS-CoV-2 by FDA under an Emergency Use Authorization (EUA). This EUA will remain  in effect (meaning this test can be used) for the duration of the COVID-19 declaration under Section 56 4(b)(1) of the Act, 21 U.S.C. section 360bbb-3(b)(1), unless the authorization is terminated or revoked sooner. Performed at [facility], [address] [place], [place] [address]      Radiology Studies: Dg Chest 2 View  Result Date: 10/29/2018 CLINICAL DATA:  Weakness EXAM: CHEST - 2 VIEW COMPARISON:  05/17/2015 FINDINGS: There is no focal consolidation. There is no pleural effusion or  pneumothorax. The heart and mediastinal contours are unremarkable. There is a dual lead cardiac pacemaker. There is no acute osseous abnormality. IMPRESSION: No active cardiopulmonary disease. Electronically Signed   By: Elige KoHetal  Patel   On: 10/29/2018 18:27   Ct Head Wo Contrast  Result Date: 10/29/2018 CLINICAL DATA:  Generalized muscle weakness. EXAM: CT HEAD WITHOUT CONTRAST TECHNIQUE: Contiguous axial images were obtained from the base of the skull through the vertex without intravenous contrast. COMPARISON:  08/23/2018 FINDINGS: Brain: There is no evidence of acute infarct, intracranial hemorrhage, mass, midline shift, or extra-axial fluid collection. Mild cerebral atrophy is unchanged and within normal limits for age. Vascular: No hyperdense vessel. Skull: No fracture or focal osseous lesion. Sinuses/Orbits: Visualized paranasal sinuses and mastoid air cells are clear. Bilateral cataract extraction is noted. Other: None. IMPRESSION: Unremarkable CT appearance of the brain for age. Electronically Signed   By: Sebastian AcheAllen  Grady M.D.   On: 10/29/2018 18:09    Scheduled Meds: . apixaban  5 mg Oral BID  . aspirin EC  81 mg Oral Daily  . memantine  28 mg Oral Daily   And  . donepezil  10 mg Oral Daily  . levothyroxine  50 mcg Oral Daily  . loratadine  10 mg Oral Daily  . losartan  100 mg Oral Daily  . potassium chloride SA  20 mEq Oral BID  . tamsulosin  0.4 mg Oral QPC supper   Continuous Infusions:   LOS: 0 days    Time spent: 25 minutes.    Vassie Lollarlos Citlalic Norlander, MD [facility] Pager [phone]   10/31/2018, 9:47 AM

## 2018-11-01 ENCOUNTER — Inpatient Hospital Stay
Admission: RE | Admit: 2018-11-01 | Discharge: 2018-11-20 | Disposition: A | Discharge status: Home / Self Care | Payer: Medicare Other | Source: Ambulatory Visit | Attending: Internal Medicine | Admitting: Internal Medicine

## 2018-11-01 LAB — BASIC METABOLIC PANEL
Anion gap: 8 (ref 5–15)
BUN: 19 mg/dL (ref 8–23)
CO2: 27 mmol/L (ref 22–32)
Calcium: 8.7 mg/dL — ABNORMAL LOW (ref 8.9–10.3)
Chloride: 105 mmol/L (ref 98–111)
Creatinine, Ser: 1.14 mg/dL (ref 0.61–1.24)
GFR calc Af Amer: >60 mL/min (ref >60)
GFR calc non Af Amer: >60 mL/min (ref >60)
Glucose, Bld: 94 mg/dL (ref 70–99)
Potassium: 3.9 mmol/L (ref 3.5–5.1)
Sodium: 140 mmol/L (ref 135–145)

## 2018-11-01 NOTE — Progress Notes (Signed)
Patient seen and examined. In no acute distress. Discharge summary reviewed and changes made. Labs stable and WNL. Patient is hemodynamically stable and ready for discharge to SNF. Outpatient follow up with Dr. Merlene Laughter neurology as previously scheduled.  Barton Dubois MD [phone]

## 2018-11-01 NOTE — NC FL2 (Signed)
[place] MEDICAID FL2 LEVEL OF CARE SCREENING TOOL     IDENTIFICATION  Patient Name: Charles AhmadiClarence E Stanley Birthdate: 1940/05/18 Sex: male Admission Date (Current Location): 10/29/2018  [facility] [place]   Facility and Address:  [facility],  [address], [place] [id number]      Provider Number: [id number]  Attending Physician Name and Address:  Vassie LollMadera, Carlos, MD  Relative Name and Phone Number:  Lavena BullionLorraine - Spouse  [phone]    Current Level of Care: Hospital Recommended Level of Care: Skilled Nursing Facility Prior Approval Number:    Date Approved/Denied:   PASRR Number: [id number] [id number]  Discharge Plan: SNF    Current Diagnoses: Patient Active Problem List   Diagnosis Date Noted  . AKI (acute kidney injury) (HCC)   . Atrial fibrillation, chronic   . Alzheimer's dementia with behavioral disturbance (HCC)   . Generalized weakness 10/29/2018  . Physical deconditioning 10/29/2018  . Dementia without behavioral disturbance (HCC) 10/29/2018  . Parkinson disease (HCC) 10/29/2018  . Bradycardia 10/29/2018  . Debility 10/29/2018    Orientation RESPIRATION BLADDER Height & Weight     Self, Time, Situation, Place  Normal Continent Weight: 91.6 kg Height:  5\' 8"  (172.7 cm)  BEHAVIORAL SYMPTOMS/MOOD NEUROLOGICAL BOWEL NUTRITION STATUS      Continent Diet(regular diet)  AMBULATORY STATUS COMMUNICATION OF NEEDS Skin   Extensive Assist Verbally Normal                       Personal Care Assistance Level of Assistance  Bathing, Feeding, Dressing Bathing Assistance: Maximum assistance Feeding assistance: Independent Dressing Assistance: Limited assistance     Functional Limitations Info  Sight, Hearing, Speech Sight Info: Adequate Hearing Info: Impaired Speech Info: Adequate    SPECIAL CARE FACTORS FREQUENCY  PT (By licensed PT)     PT Frequency: 5 times a week              Contractures Contractures Info: Not  present    Additional Factors Info  Code Status, Allergies Code Status Info: Full Allergies Info: NKDA           Current Medications (11/01/2018):  This is the current hospital active medication list Current Facility-Administered Medications  Medication Dose Route Frequency Provider Last Rate Last Dose  . acetaminophen (TYLENOL) tablet 650 mg  650 mg Oral Q6H PRN Elgergawy, Leana Roeawood S, MD   650 mg at 10/30/18 0445   Or  . acetaminophen (TYLENOL) suppository 650 mg  650 mg Rectal Q6H PRN Elgergawy, Leana Roeawood S, MD      . apixaban (ELIQUIS) tablet 5 mg  5 mg Oral BID Elgergawy, Leana Roeawood S, MD   5 mg at 11/01/18 1102  . aspirin EC tablet 81 mg  81 mg Oral Daily Elgergawy, Leana Roeawood S, MD   81 mg at 11/01/18 1101  . memantine (NAMENDA XR) 24 hr capsule 28 mg  28 mg Oral Daily Vassie LollMadera, Carlos, MD   28 mg at 11/01/18 1101   And  . donepezil (ARICEPT) tablet 10 mg  10 mg Oral Daily Vassie LollMadera, Carlos, MD   10 mg at 11/01/18 1101  . levothyroxine (SYNTHROID) tablet 50 mcg  50 mcg Oral Daily Elgergawy, Leana Roeawood S, MD   50 mcg at 11/01/18 0620  . loratadine (CLARITIN) tablet 10 mg  10 mg Oral Daily Elgergawy, Leana Roeawood S, MD   10 mg at 11/01/18 1101  . LORazepam (ATIVAN) tablet 0.5 mg  0.5 mg Oral Daily PRN Elgergawy,  Silver Huguenin, MD      . losartan (COZAAR) tablet 100 mg  100 mg Oral Daily Elgergawy, Silver Huguenin, MD   100 mg at 11/01/18 1101  . potassium chloride SA (K-DUR) CR tablet 20 mEq  20 mEq Oral BID Elgergawy, Silver Huguenin, MD   20 mEq at 11/01/18 1101  . tamsulosin (FLOMAX) capsule 0.4 mg  0.4 mg Oral QPC supper Elgergawy, Silver Huguenin, MD   0.4 mg at 10/31/18 1744     Discharge Medications: Please see discharge summary for a list of discharge medications.  Relevant Imaging Results:  Relevant Lab Results:   Additional Information SS# [id number]  Boneta Lucks, RN

## 2018-11-01 NOTE — TOC Transition Note (Signed)
Transition of Care [facility]) - CM/SW Discharge Note   Patient Details  Name: Charles Stanley MRN: [id number] Date of Birth: 1940/03/19  [facility]) CM/SW Contact:  Boneta Lucks, RN Phone Number: 11/01/2018, 3:15 PM   Clinical Narrative:   Patient Insurance Auth apporved Ref # [id number] Auth # [id number] for 3 days. Gave # to Keri, Patient discharging to [facility] today. RN to call report, Edwena Felty at the bedside, she was call and update their daughter.  Daughter is calling [facility] to get a list of belonging to take to the facility.     Final next level of care: Skilled Nursing Facility Barriers to Discharge: Barriers Resolved   Patient Goals and CMS Choice Patient states their goals for this hospitalization and ongoing recovery are:: to go to SNF, then return home. CMS Medicare.gov Compare Post Acute Care list provided to:: Patient Represenative (must comment)(daughter) Choice offered to / list presented to : Adult Children  Discharge Placement              Patient [facility] bed at: [facility] Patient to be transferred to facility by: [facility] Staff Name of family member notified: Edwena Felty - wife at the bedside, she will call her daughter Patient and family notified of of transfer: 11/01/18  Discharge Plan and Services   Discharge Planning Services: CM Consult Post Acute Care Choice: Home Health                    HH Arranged: RN, PT, OT, Nurse's Aide, Social Work [facility] Agency: [place] ([place]) Date [place]: 10/30/18 Time [place]: 1618 Representative spoke with at [place]: Floydene Flock       Readmission Risk Interventions No flowsheet data found.

## 2018-11-01 NOTE — Progress Notes (Signed)
Nursing home choices Given to daughter:  [facility] [place], [place] [phone]   Add [place] My Favorites- [place] Born in a new window 5 out of 5 starsfootnote Much Above Average 5 out of 5 starsfootnote Much Above Average 3 out of 5 starsfootnote Average 3 out of 5 starsfootnote Average 2.9 [place] [facility], [place] [address] [phone]   [place] My Favorites- Opens in a new window 2 out of 5 starsfootnote Below Average 2 out of 5 starsfootnote Below Average 2 out of 5 starsfootnote Below Average 2 out of 5 starsfootnote Below Average 3.0 [place], [place] [address] [phone]   Add [facility] My Favorites- Opens in a new window 4 out of 5 starsfootnote Above Average 4 out of 5 starsfootnote Above Average 2 out of 5 starsfootnote Below Average 4 out of 5 starsfootnote Above Average 10.7 [place], [place] [address] [phone]   [facility] My Favorites- Opens in a new window 3 out of 5 starsfootnote Average 3 out of 5 starsfootnote Average 2 out of 5 starsfootnote Below Average 3 out of 5 starsfootnote Average 12.5 [facility] [place], [place] [address] [phone]   Add [facility] My Favorites- Opens in a new window 1 out of 5 starsfootnote Much Below Average 2 out of 5 starsfootnote Below Average 1 out of 5 starsfootnote Much Below Average 3 out of 5 starsfootnote Average 14.7 [place] [address] [place], [place] [address] [phone]   Add [facility] My Favorites- Opens in a new window 4 out of 5 starsfootnote Above Average 3 out of 5  starsfootnote Average 2 out of 5 starsfootnote Below Average 5 out of 5 starsfootnote Much Above Average 22.5 [facility] [address] [place], [place] [address] [phone]   Add [place] My Favorites- Opens in a new window 1 out of 5 starsfootnote Much Below Average 1 out of 5 starsfootnote Much Below Average 2 out of 5 starsfootnote Below Average 2 out of 5 starsfootnote Below Average 24.2 [place], [place] [address] [phone]   [place] [facility] My Favorites- [place] Born in a new window 3 out of 5 starsfootnote Average 3 out of 5 starsfootnote Average 2 out of 5 starsfootnote Below Average 2 out of 5 starsfootnote Below Average 24.4 [place] [address] [place], [place] [address] [phone]   [place] My Favorites- Opens in a new window 1 out of 5 starsfootnote Much Below Average 1 out of 5 starsfootnote Much Below Average 2 out of 5 starsfootnote Below Average 3 out of 5 starsfootnote Average 25.5 [place] [address] [place], [place] [address] [phone]   [place] My Favorites- Opens in a new window 2 out of 5 starsfootnote Below Average 2 out of 5 starsfootnote Below Average 2 out of 5 starsfootnote Below Average 3 out of 5 starsfootnote Average 25.9 [place] [address] [place],  [address] [phone]   [place]  My Favorites- Opens in a new window 5 out of 5 starsfootnote Much Above Average 5 out of 5 starsfootnote Much Above Average 3 out of 5 starsfootnote Average 2 out of 5 starsfootnote Below Average 26.1 [facility] [address] [place], [place] [address] [phone]   Add [facility] My Favorites- Opens in a new window 3 out of 5 starsfootnote Average 3 out of 5 starsfootnote Average 2 out of 5 starsfootnote Below Average 4 out of 5 starsfootnote Above Average 26.7 [facility] [address] [place], [place] [address] [phone]   Add [facility] My Favorites- Opens in a new window 3 out of 5 starsfootnote Average 3 out of 5 starsfootnote Average 3 out of 5 starsfootnote Average 4 out of 5 starsfootnote Above Average 27.0 [employer] [facility] [place] [facility] [address] [place], [place] [address] [phone]   Add [facility] [place] [facility] My Favorites- Opens in a new window 2 out of 5 starsfootnote Below Average 2 out of 5 starsfootnote Below Average 2 out of 5 starsfootnote Below Average 2 out of 5 starsfootnote Below Average 27.0 [facility] [address] [place], [place] [address] [phone]   Add [facility] My Favorites- Opens in a new window 1 out of 5 starsfootnote Much Below Average 1 out of 5 starsfootnote Much Below Average 2 out of 5 starsfootnote Below Average 1 out of 5 starsfootnote Much Below Average 27.1 [facility] [address] [place], [place] [phone]   Add [facility] My Favorites- Opens in a new window 3 out of 5 starsfootnote Average 3 out of 5 starsfootnote Average 2 out of 5 starsfootnote Below Average 4 out of 5 starsfootnote Above Average 27.8 [facility] [address] [place], [place] [address] [phone]   Add [facility] My Favorites- Opens in a new window 5 out of 5 starsfootnote Much Above Average 5 out of 5 starsfootnote Much Above Average 5 out of  5 starsfootnote Much Above Average 5 out of 5 starsfootnote Much Above Average 28.0 [place] [facility] [address] [place], [place] [address] [phone]   Add [facility] My Favorites- Opens in a new window 5 out of 5 starsfootnote Much Above Average 4 out of 5 starsfootnote Above Average 5 out of 5 starsfootnote Much Above Average 5 out of 5 starsfootnote Much Above Average 28.1 [facility] [address] [place], [place] [address] [phone]   Add [facility] My Favorites- Opens in a new window 5 out of 5 starsfootnote Much Above Average 4 out of 5 starsfootnote Above Average 5 out of 5 starsfootnote Much Above Average 5 out of 5 starsfootnote Much Above Average 28.2 [facility] [address] [place], [place] [address] [phone]   Add [facility] My Favorites- Opens in a new window 5 out of 5 starsfootnote Much Above Average 4 out of 5 starsfootnote Above Average 5 out of 5 starsfootnote Much Above Average 5 out of 5 starsfootnote Much Above Average 28.3 [place]

## 2018-11-01 NOTE — TOC Initial Note (Signed)
Transition of Care [facility]) - Initial/Assessment Note    Patient Details  Name: Charles Stanley MRN: [id number] Date of Birth: Jan 14, 1941  Transition of Care [facility]) CM/SW Contact:    Boneta Lucks, RN Phone Number: 11/01/2018, 11:13 AM  Clinical Narrative:      Patient admitted for generalized weakness. Patient lives at home with his wife. New Parkinson diagnosis and weakness. Initially planning discharge with Home health, after working with PT, recommended SNF. Patient and family agrees. Patient ask CM to call his family, Choices given to daughter.   FL2 done and referrals sent. TOC to follow.              Expected Discharge Plan: Skilled Nursing Facility Barriers to Discharge: Insurance Authorization, SNF Pending bed offer   Patient Goals and CMS Choice Patient states their goals for this hospitalization and ongoing recovery are:: to go to SNF, then return home. CMS Medicare.gov Compare Post Acute Care list provided to:: Patient Represenative (must comment)(daughter) Choice offered to / list presented to : Adult Children  Expected Discharge Plan and Services Expected Discharge Plan: [place]   Discharge Planning Services: CM Consult  Prior Living Arrangements/Services       Do you feel safe going back to the place where you live?: Yes      Need for Family Participation in Patient Care: Yes (Comment) Care giver support system in place?: Yes (comment)   Criminal Activity/Legal Involvement Pertinent to Current Situation/Hospitalization: No - Comment as needed  Activities of Daily Living Home Assistive Devices/Equipment: None ADL Screening (condition at time of admission) Patient's cognitive ability adequate to safely complete daily activities?: No Is the patient deaf or have difficulty hearing?: No Does the patient have difficulty seeing, even when wearing glasses/contacts?: No Does the patient have difficulty concentrating, remembering, or making  decisions?: Yes Patient able to express need for assistance with ADLs?: No Does the patient have difficulty dressing or bathing?: Yes Independently performs ADLs?: No Communication: Needs assistance Is this a change from baseline?: Pre-admission baseline Dressing (OT): Needs assistance Is this a change from baseline?: Pre-admission baseline Grooming: Needs assistance Is this a change from baseline?: Pre-admission baseline Feeding: Needs assistance Is this a change from baseline?: Pre-admission baseline Bathing: Needs assistance Is this a change from baseline?: Pre-admission baseline Toileting: Needs assistance Is this a change from baseline?: Pre-admission baseline In/Out Bed: Dependent Is this a change from baseline?: Pre-admission baseline Walks in Home: Dependent Is this a change from baseline?: Pre-admission baseline Does the patient have difficulty walking or climbing stairs?: Yes Weakness of Legs: Both Weakness of Arms/Hands: Both  Permission Sought/Granted Permission sought to share information with : Case Manager, Customer service manager, Family Supports Permission granted to share information with : Yes, Verbal Permission Granted     Permission granted to share info w AGENCY: [place] granted to share info w Relationship: Wife and daughter     Emotional Assessment       Orientation: : Oriented to Self, Oriented to Place, Oriented to  Time, Oriented to Situation Alcohol / Substance Use: Not Applicable Psych Involvement: No (comment)  Admission diagnosis:  Generalized weakness [R53.1] AKI (acute kidney injury) ([place]) [N17.9] Debility [R53.81] Patient Active Problem List   Diagnosis Date Noted  . AKI (acute kidney injury) ([place])   . Atrial fibrillation, chronic   . Alzheimer's dementia with behavioral disturbance ([place])   . Generalized weakness 10/29/2018  . Physical deconditioning 10/29/2018  . Dementia without behavioral  disturbance (HCC) 10/29/2018  . Parkinson disease (HCC) 10/29/2018  . Bradycardia 10/29/2018  . Debility 10/29/2018   PCP:  Manfred Archrowder, Jonathan Earl, MD Pharmacy:   [facility] [address] - [place], [place] - [address] [place] [address] Phone: [phone] Fax: [phone]  [facility] [address] - [place], [place] - [address] [place] [address] [place] [address] [place] [address] Phone: [phone] Fax: [phone]   Readmission Risk Interventions No flowsheet data found.

## 2018-11-01 NOTE — Progress Notes (Signed)
OT Cancellation Note  Patient Details Name: Charles Stanley MRN: [id number] DOB: 1940-10-19   Cancelled Treatment:    Reason Eval/Treat Not Completed: Other (comment). OT orders received and patient's chart reviewed. Patient with discharge orders in this AM from MD. PT over the weekend has recommended SNF at discharge due to patient requiring increased physical assistance with basic daily tasks and functional mobility. Will defer OT evaluation to staff OT at SNF.     Ailene Ravel, OTR/L,CBIS  [phone]  11/01/2018, 8:12 AM

## 2018-11-01 NOTE — Progress Notes (Signed)
Pt report given to Areatha Keas, LPN at [facility]. Wife and patient both aware of pending discharge and agreeable.

## 2018-11-02 ENCOUNTER — Encounter: Payer: Self-pay | Admitting: Adult Health

## 2018-11-02 ENCOUNTER — Non-Acute Institutional Stay (SKILLED_NURSING_FACILITY): Payer: Medicare Other | Admitting: Adult Health

## 2018-11-02 ENCOUNTER — Other Ambulatory Visit: Payer: Self-pay | Admitting: Adult Health

## 2018-11-02 DIAGNOSIS — J3089 Other allergic rhinitis: Secondary | ICD-10-CM

## 2018-11-02 DIAGNOSIS — G309 Alzheimer's disease, unspecified: Secondary | ICD-10-CM

## 2018-11-02 DIAGNOSIS — E039 Hypothyroidism, unspecified: Secondary | ICD-10-CM

## 2018-11-02 DIAGNOSIS — Z950 Presence of cardiac pacemaker: Secondary | ICD-10-CM

## 2018-11-02 DIAGNOSIS — N401 Enlarged prostate with lower urinary tract symptoms: Secondary | ICD-10-CM

## 2018-11-02 DIAGNOSIS — F0393 Unspecified dementia, unspecified severity, with mood disturbance: Secondary | ICD-10-CM

## 2018-11-02 DIAGNOSIS — I10 Essential (primary) hypertension: Secondary | ICD-10-CM

## 2018-11-02 DIAGNOSIS — G20 Parkinson's disease: Secondary | ICD-10-CM | POA: Diagnosis not present

## 2018-11-02 DIAGNOSIS — E876 Hypokalemia: Secondary | ICD-10-CM

## 2018-11-02 DIAGNOSIS — N138 Other obstructive and reflux uropathy: Secondary | ICD-10-CM

## 2018-11-02 DIAGNOSIS — F0280 Dementia in other diseases classified elsewhere without behavioral disturbance: Secondary | ICD-10-CM

## 2018-11-02 DIAGNOSIS — E538 Deficiency of other specified B group vitamins: Secondary | ICD-10-CM

## 2018-11-02 DIAGNOSIS — F329 Major depressive disorder, single episode, unspecified: Secondary | ICD-10-CM

## 2018-11-02 DIAGNOSIS — I480 Paroxysmal atrial fibrillation: Secondary | ICD-10-CM | POA: Diagnosis not present

## 2018-11-02 DIAGNOSIS — F0281 Dementia in other diseases classified elsewhere with behavioral disturbance: Secondary | ICD-10-CM

## 2018-11-02 MED ORDER — LORAZEPAM 0.5 MG PO TABS: 0.5000 mg | ORAL_TABLET | Freq: Every day | ORAL | 0 refills | Status: AC | PRN

## 2018-11-02 NOTE — Progress Notes (Signed)
: Provider:  Margit HanksAlexander, Anne D., MD Location:  [facility] Room Number: 157-W Place of Service:  SNF ([phone])  PCP: Jaci Lazierrowder, Pete PeltJonathan Earl, MD Patient Care Team: Jaci Lazierrowder, Pete PeltJonathan Earl, MD as PCP - General [facility](Family Medicine)  Extended Emergency Contact Information Primary Emergency Contact: Poteat,Glynis Address: [address]          [place], [place] [address] [place] Home Phone: [phone] Mobile Phone: [phone] Relation: Daughter Secondary Emergency Contact: Mechele DawleySimpson, Lorraine Home Phone: [phone] Relation: Spouse Interpreter needed? No     Allergies: Patient has no known allergies.  Chief Complaint  Patient presents with  . New Admit To SNF    [facility] admission to [facility]    HPI: Patient is an 78 y.o. male with history of bradycardia, status post pacemaker placement, BPH, hyperlipidemia, hypertension, dementia, and recent diagnosis of Parkinson's disease who was brought to the ED by his wife for progressive generalized weakness over the prior 2 weeks.  Patient has not started any treatment for his Parkinson's.  Patient was able to ambulate with a walker formally but now he is unable to stand secondary to unsteady gait.  There are no focal weaknesses.  Patient also has poor appetite.  In the ED CT head with no acute findings.  Patient was admitted to [facility] from 9/18-21 where he was treated for generalized weakness, and acute kidney injury in the setting of poor p.o. intake which improved with IV fluid resuscitation.  All other problems being at their baseline patient is admitted to skilled nursing facility for OT/PT.  While at skilled nursing facility patient will be followed for paroxysmal atrial fibrillation status post pacemaker implantation treated with Eliquis, dementia treated with Namenda and Aricept and BPH treated with Rapaflo.  Past Medical History:  Diagnosis Date  . BPH (benign prostatic hyperplasia)   .  Bradycardia   . Hyperlipemia   . Hypertension   . Hypotension   . Memory impairment     Past Surgical History:  Procedure Laterality Date  . PACEMAKER PLACEMENT      Allergies as of 11/03/2018   No Known Allergies     Medication List       Accurate as of November 03, 2018 11:59 PM. If you have any questions, ask your nurse or doctor.        aspirin EC 81 MG tablet Take 81 mg by mouth daily.   donepezil 10 MG tablet Commonly known as: ARICEPT Take 1 tablet by mouth daily.   Eliquis 5 MG Tabs tablet Generic drug: apixaban Take 1 tablet by mouth 2 (two) times daily.   feeding supplement (GLUCERNA SHAKE) Liqd Take 237 mLs by mouth daily.   levothyroxine 50 MCG tablet Commonly known as: SYNTHROID Take 1 tablet by mouth daily.   loratadine 10 MG tablet Commonly known as: CLARITIN Take 10 mg by mouth daily.   LORazepam 0.5 MG tablet Commonly known as: ATIVAN Take 1 tablet (0.5 mg total) by mouth daily as needed for up to 10 days. anxiety   losartan 100 MG tablet Commonly known as: COZAAR Take 1 tablet by mouth daily.   Namzaric 28-10 MG Cp24 Generic drug: Memantine HCl-Donepezil HCl Take 1 capsule by mouth daily.   NON FORMULARY Diet: Regular, NAS, Consistent Carbohydrate   Potassium Chloride ER 20 MEQ Tbcr Take 1 tablet by mouth daily.   sertraline 100 MG tablet Commonly known as: ZOLOFT Take 1 tablet by mouth daily.   silodosin  8 MG Caps capsule Commonly known as: RAPAFLO Take 1 capsule by mouth daily.       No orders of the defined types were placed in this encounter.   Immunization History  Administered Date(s) Administered  . Influenza, High Dose Seasonal PF 01/07/2017, 11/20/2017  . Pneumococcal Conjugate-13 08/16/2018    Social History   Tobacco Use  . Smoking status: Never Smoker  . Smokeless tobacco: Never Used  Substance Use Topics  . Alcohol use: No    Family history is dad with diabetes mellitus and MI  No family  history on file.    Review of Systems  DATA OBTAINED: from patient, nurse, GENERAL:  no fevers, fatigue, appetite changes SKIN: No itching, or rash EYES: No eye pain, redness, discharge EARS: No earache, tinnitus, change in hearing NOSE: No congestion, drainage or bleeding  MOUTH/THROAT: No mouth or tooth pain, No sore throat RESPIRATORY: No cough, wheezing, SOB CARDIAC: No chest pain, palpitations, lower extremity edema  GI: No abdominal pain, No N/V/D or constipation, No heartburn or reflux  GU: No dysuria, frequency or urgency, or incontinence  MUSCULOSKELETAL: No unrelieved bone/joint pain NEUROLOGIC: No headache, dizziness or focal weakness PSYCHIATRIC: No c/o anxiety or sadness   Vitals:   11/03/18 1551  BP: (!) 141/81  Pulse: 67  Resp: 20  Temp: 98.2 F (36.8 C)    SpO2 Readings from Last 1 Encounters:  11/01/18 97%   Body mass index is 30.93 kg/m.     Physical Exam  GENERAL APPEARANCE: Alert, moderately conversant,  No acute distress.  SKIN: No diaphoresis rash HEAD: Normocephalic, atraumatic  EYES: Conjunctiva/lids clear. Pupils round, reactive. EOMs intact.  EARS: External exam WNL, canals clear. Hearing grossly normal.  NOSE: No deformity or discharge.  MOUTH/THROAT: Lips w/o lesions  RESPIRATORY: Breathing is even, unlabored. Lung sounds are clear   CARDIOVASCULAR: Heart RRR no murmurs, rubs or gallops. No peripheral edema.   GASTROINTESTINAL: Abdomen is soft, non-tender, not distended w/ normal bowel sounds. GENITOURINARY: Bladder non tender, not distended  MUSCULOSKELETAL: No abnormal joints or musculature NEUROLOGIC:  Cranial nerves 2-12 grossly intact. Moves all extremities with some stiffness PSYCHIATRIC: Mood and affect appropriate with dementia, no behavioral issues  Patient Active Problem List   Diagnosis Date Noted  . AKI (acute kidney injury) ([place])   . Atrial fibrillation, chronic   . Alzheimer's dementia with behavioral disturbance  ([place])   . Generalized weakness 10/29/2018  . Physical deconditioning 10/29/2018  . Dementia without behavioral disturbance ([place]) 10/29/2018  . Parkinson disease ([place]) 10/29/2018  . Bradycardia 10/29/2018  . Debility 10/29/2018      Labs reviewed: Basic Metabolic Panel:    Component Value Date/Time   NA 140 11/01/2018 0555   K 3.9 11/01/2018 0555   CL 105 11/01/2018 0555   CO2 27 11/01/2018 0555   GLUCOSE 94 11/01/2018 0555   BUN 19 11/01/2018 0555   CREATININE 1.14 11/01/2018 0555   CALCIUM 8.7 (L) 11/01/2018 0555   PROT 7.5 10/29/2018 1734   ALBUMIN 4.0 10/29/2018 1734   AST 110 (H) 10/29/2018 1734   ALT 33 10/29/2018 1734   ALKPHOS 75 10/29/2018 1734   BILITOT 0.6 10/29/2018 1734   GFRNONAA >60 11/01/2018 0555   GFRAA >60 11/01/2018 0555    Recent Labs    10/29/18 1734 10/29/18 1907 10/30/18 0618 11/01/18 0555  NA 136  --  140 140  K 3.8  --  3.8 3.9  CL 103  --  107 105  CO2  25  --  24 27  GLUCOSE 85  --  81 94  BUN 17  --  17 19  CREATININE 1.49*  --  1.20 1.14  CALCIUM 8.8*  --  9.0 8.7*  MG  --  2.3  --   --    Liver Function Tests: Recent Labs    10/29/18 1734  AST 110*  ALT 33  ALKPHOS 75  BILITOT 0.6  PROT 7.5  ALBUMIN 4.0   No results for input(s): LIPASE, AMYLASE in the last 8760 hours. No results for input(s): AMMONIA in the last 8760 hours. CBC: Recent Labs    10/29/18 1734 10/30/18 0618  WBC 9.1 9.2  NEUTROABS 6.7  --   HGB 13.0 12.7*  HCT 44.3 42.7  MCV 83.3 83.6  PLT 223 205   Lipid No results for input(s): CHOL, HDL, LDLCALC, TRIG in the last 8760 hours.  Cardiac Enzymes: No results for input(s): CKTOTAL, CKMB, CKMBINDEX, TROPONINI in the last 8760 hours. BNP: Recent Labs    10/29/18 1907  BNP 101.0*   No results found for: MICROALBUR No results found for: HGBA1C Lab Results  Component Value Date   TSH 3.398 10/31/2018   Lab Results  Component Value Date   VITAMINB12 289 10/30/2018   No results found for:  FOLATE No results found for: IRON, TIBC, FERRITIN  Imaging and Procedures obtained prior to SNF admission: Dg Chest 2 View  Result Date: 10/29/2018 CLINICAL DATA:  Weakness EXAM: CHEST - 2 VIEW COMPARISON:  05/17/2015 FINDINGS: There is no focal consolidation. There is no pleural effusion or pneumothorax. The heart and mediastinal contours are unremarkable. There is a dual lead cardiac pacemaker. There is no acute osseous abnormality. IMPRESSION: No active cardiopulmonary disease. Electronically Signed   By: Elige Ko   On: 10/29/2018 18:27   Ct Head Wo Contrast  Result Date: 10/29/2018 CLINICAL DATA:  Generalized muscle weakness. EXAM: CT HEAD WITHOUT CONTRAST TECHNIQUE: Contiguous axial images were obtained from the base of the skull through the vertex without intravenous contrast. COMPARISON:  08/23/2018 FINDINGS: Brain: There is no evidence of acute infarct, intracranial hemorrhage, mass, midline shift, or extra-axial fluid collection. Mild cerebral atrophy is unchanged and within normal limits for age. Vascular: No hyperdense vessel. Skull: No fracture or focal osseous lesion. Sinuses/Orbits: Visualized paranasal sinuses and mastoid air cells are clear. Bilateral cataract extraction is noted. Other: None. IMPRESSION: Unremarkable CT appearance of the brain for age. Electronically Signed   By: Sebastian Ache M.D.   On: 10/29/2018 18:09     Not all labs, radiology exams or other studies done during hospitalization come through on my EPIC note; however they are reviewed by me.    Assessment and Plan  Generalized weakness- progressive over the past weeks in the setting of advanced dementia decreased oral intake and Parkinson's disease SNF-admitted for OT/PT; follow-up with neurology to initiate treatment for Parkinson's  Acute kidney injurY- in the setting of poor p.o. intake; improved and back to baseline with fluid resuscitation SNF- continue to encourage p.o. fluids; follow-up  BMP  Paroxysmal atrial fibrillation/tachybradycardia syndrome SNF- rate control with pacemaker implantation, with secondary prevention with Eliquis 5 mg twice daily  Dementia with behaviors SNF- continue Namzaric 28-10 1 p.o. daily  History of BPH SNF-no current problems continue Rapaflo 8 mg daily  Hypothyroidism SNF-not stated as uncontrolled; continue Synthroid 50 mcg daily  Hypertension SNF- controlled; continue losartan 100 mg daily   Time spent greater than 45 minutes;> 50% of  time with patient was spent reviewing records, labs, tests and studies, counseling and developing plan of care  Margit Hanks, MD

## 2018-11-02 NOTE — Progress Notes (Signed)
Location:    [place] Room Number: 157/W Place of Service:  SNF (31)   CODE STATUS: Full Code  No Known Allergies  Chief Complaint  Patient presents with  . Hospitalization Follow-up    Hospitalization Follow Up    HPI:  He is a 78 year old man who has been hospitalized from 10-29-18 through 11-01-18. He had been getting progressively weaker at home. He presented to the ED with increased weakness. He was treated for mild acute renal failure. He is here for short term rehab. He lives with his wife; he been using a walker for the past several months. He has recently been diagnosed with parkinson's disease; has not started treatment. He is incontinent of bladder and bowel. He denies any pain. There are no reports of agitation. He will continue to be followed for his chronic illnesses including: afib; hypertension; allergic rhinitis.   Past Medical History:  Diagnosis Date  . BPH (benign prostatic hyperplasia)   . Bradycardia   . Hyperlipemia   . Hypertension   . Hypotension   . Memory impairment     Past Surgical History:  Procedure Laterality Date  . PACEMAKER PLACEMENT      Social History   Socioeconomic History  . Marital status: Married    Spouse name: Not on file  . Number of children: Not on file  . Years of education: Not on file  . Highest education level: Not on file  Occupational History  . Not on file  Social Needs  . Financial resource strain: Not on file  . Food insecurity    Worry: Not on file    Inability: Not on file  . Transportation needs    Medical: Not on file    Non-medical: Not on file  Tobacco Use  . Smoking status: Never Smoker  . Smokeless tobacco: Never Used  Substance and Sexual Activity  . Alcohol use: No  . Drug use: No  . Sexual activity: Not on file  Lifestyle  . Physical activity    Days per week: Not on file    Minutes per session: Not on file  . Stress: Not on file  Relationships  . Social  Herbalist on phone: Not on file    Gets together: Not on file    Attends religious service: Not on file    Active member of club or organization: Not on file    Attends meetings of clubs or organizations: Not on file    Relationship status: Not on file  . Intimate partner violence    Fear of current or ex partner: Not on file    Emotionally abused: Not on file    Physically abused: Not on file    Forced sexual activity: Not on file  Other Topics Concern  . Not on file  Social History Narrative  . Not on file   Family History  Problem Relation Age of Onset  . Diabetes Father   . Heart disease Father   . Heart disease Sister       VITAL SIGNS BP (!) 141/81   Pulse 67   Temp 98.2 F (36.8 C) (Oral)   Resp 20   Ht 5\' 8"  (1.727 m)   Wt 203 lb 6.4 oz (92.3 kg)   BMI 30.93 kg/m   Outpatient Encounter Medications as of 11/02/2018  Medication Sig  . apixaban (ELIQUIS) 5 MG TABS tablet Take 1 tablet by mouth 2 (two)  times daily.  Marland Kitchen aspirin EC 81 MG tablet Take 81 mg by mouth daily.  Marland Kitchen donepezil (ARICEPT) 10 MG tablet Take 1 tablet by mouth daily.  Marland Kitchen levothyroxine (SYNTHROID) 50 MCG tablet Take 1 tablet by mouth daily.  Marland Kitchen loratadine (CLARITIN) 10 MG tablet Take 10 mg by mouth daily.  Marland Kitchen LORazepam (ATIVAN) 0.5 MG tablet Take 1 tablet (0.5 mg total) by mouth daily as needed for up to 10 days. anxiety  . losartan (COZAAR) 100 MG tablet Take 1 tablet by mouth daily.  . Memantine HCl-Donepezil HCl (NAMZARIC) 28-10 MG CP24 Take 1 capsule by mouth daily.  . NON FORMULARY Diet: Regular, NAS, Consistent Carbohydrate  . Potassium Chloride ER 20 MEQ TBCR Take 1 tablet by mouth daily.  . sertraline (ZOLOFT) 100 MG tablet Take 1 tablet by mouth daily.  . silodosin (RAPAFLO) 8 MG CAPS capsule Take 1 capsule by mouth daily.   No facility-administered encounter medications on file as of 11/02/2018.      SIGNIFICANT DIAGNOSTIC EXAMS  TODAY;   10-29-18: ct of head: Unremarkable  CT appearance of the brain for age.   10-29-18: chest x-ray:  No active cardiopulmonary disease.   LABS REVIEWED TODAY;   10-29-18 wbc 9.1; hgb 13.0; hct 44.3; mcv 83.3 plt 223; glucose 85; bun 17; creat 1.49; k+ 3.8; na++ 136; ca 8.8; ast 110; albumin 4.0; mag 2.3; urine culture 30,000 colonies 10-30-18: vit B 12: 289; tsh 3.398 11-01-18: glucose 94; bun 19; creat 1.14; k+ 3.9; na++ 140; ca 8.7   Review of Systems  Constitutional: Negative for malaise/fatigue.  Respiratory: Negative for cough.   Cardiovascular: Negative for chest pain and leg swelling.  Gastrointestinal: Negative for abdominal pain.  Musculoskeletal: Negative for back pain and joint pain.  Skin: Negative.   Neurological: Negative for dizziness.  Psychiatric/Behavioral: The patient is not nervous/anxious.     Physical Exam Constitutional:      General: He is not in acute distress.    Appearance: He is well-developed. He is not diaphoretic.  Neck:     Musculoskeletal: Neck supple.     Thyroid: No thyromegaly.  Cardiovascular:     Rate and Rhythm: Normal rate and regular rhythm.     Pulses: Normal pulses.     Heart sounds: Normal heart sounds.     Comments: Visual merchandiser present  Pulmonary:     Effort: Pulmonary effort is normal. No respiratory distress.     Breath sounds: Normal breath sounds.  Abdominal:     General: Bowel sounds are normal. There is no distension.     Palpations: Abdomen is soft.     Tenderness: There is no abdominal tenderness.  Musculoskeletal: Normal range of motion.     Right lower leg: Edema present.     Left lower leg: Edema present.     Comments: 1+ bilateral lower extremity edema  Lymphadenopathy:     Cervical: No cervical adenopathy.  Skin:    General: Skin is warm and dry.  Neurological:     Mental Status: He is alert. Mental status is at baseline.  Psychiatric:        Mood and Affect: Mood normal.      ASSESSMENT/ PLAN:  TODAY;   1. Paroxysmal atrial fibrillation  status post post maker insertion: heart rate is stable will continue long term anticoagulation: eliquis 5 mg twice daily   2. Alzheimer's dementia with behavioral disturbance unspecified timing of dementia onset: no significant changes in status: weight is 203 pounds;  will continue namzaric 28/20 mg daily with aricept 10 mg daily  3. Parkinson's disease; no change in status; is a new diagnosis; is followed by neurology   4. Hypothyroidism in adult: is stable tsh 3.398 will continue synthroid 50 mcg daily   5. Essential benign hypertension: is stable b/p 141/81 will continue cozaar 100 mg daily asa 81 mg daily   6. 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   8. Hypokalemia: is stable k+ 3.9 will continue k+ 20 meq daily  9. BPH with urinary obstruction: is stable will continue rapaflo 8 mg daily   10. Vitamin B 12 deficiency: without change: b12: 289 will start 1000 mcg daily         MD is aware of resident's narcotic use and is in agreement with current plan of care. We will attempt to wean resident as appropriate.  Synthia Innocent NP [facility]  Contact [phone] Monday through Friday 8am- 5pm  After hours call [phone]

## 2018-11-03 ENCOUNTER — Non-Acute Institutional Stay (SKILLED_NURSING_FACILITY): Payer: Medicare Other | Admitting: Internal Medicine

## 2018-11-03 DIAGNOSIS — N179 Acute kidney failure, unspecified: Secondary | ICD-10-CM | POA: Diagnosis not present

## 2018-11-03 DIAGNOSIS — F0281 Dementia in other diseases classified elsewhere with behavioral disturbance: Secondary | ICD-10-CM

## 2018-11-03 DIAGNOSIS — G300 Alzheimer's disease with early onset: Secondary | ICD-10-CM

## 2018-11-03 DIAGNOSIS — I10 Essential (primary) hypertension: Secondary | ICD-10-CM | POA: Diagnosis not present

## 2018-11-03 DIAGNOSIS — R531 Weakness: Secondary | ICD-10-CM

## 2018-11-03 DIAGNOSIS — E039 Hypothyroidism, unspecified: Secondary | ICD-10-CM

## 2018-11-03 DIAGNOSIS — N401 Enlarged prostate with lower urinary tract symptoms: Secondary | ICD-10-CM

## 2018-11-03 DIAGNOSIS — G20 Parkinson's disease: Secondary | ICD-10-CM

## 2018-11-03 DIAGNOSIS — N138 Other obstructive and reflux uropathy: Secondary | ICD-10-CM

## 2018-11-03 DIAGNOSIS — I480 Paroxysmal atrial fibrillation: Secondary | ICD-10-CM | POA: Diagnosis not present

## 2018-11-03 DIAGNOSIS — F02818 Dementia in other diseases classified elsewhere, unspecified severity, with other behavioral disturbance: Secondary | ICD-10-CM

## 2018-11-05 ENCOUNTER — Encounter: Payer: Self-pay | Admitting: Adult Health

## 2018-11-05 DIAGNOSIS — Z950 Presence of cardiac pacemaker: Secondary | ICD-10-CM | POA: Insufficient documentation

## 2018-11-05 DIAGNOSIS — E538 Deficiency of other specified B group vitamins: Secondary | ICD-10-CM | POA: Insufficient documentation

## 2018-11-05 DIAGNOSIS — J3089 Other allergic rhinitis: Secondary | ICD-10-CM | POA: Insufficient documentation

## 2018-11-05 DIAGNOSIS — E876 Hypokalemia: Secondary | ICD-10-CM | POA: Insufficient documentation

## 2018-11-05 DIAGNOSIS — F0393 Unspecified dementia, unspecified severity, with mood disturbance: Secondary | ICD-10-CM | POA: Insufficient documentation

## 2018-11-05 DIAGNOSIS — I480 Paroxysmal atrial fibrillation: Secondary | ICD-10-CM | POA: Insufficient documentation

## 2018-11-05 DIAGNOSIS — F0280 Dementia in other diseases classified elsewhere without behavioral disturbance: Secondary | ICD-10-CM | POA: Insufficient documentation

## 2018-11-05 DIAGNOSIS — E039 Hypothyroidism, unspecified: Secondary | ICD-10-CM | POA: Insufficient documentation

## 2018-11-05 DIAGNOSIS — I10 Essential (primary) hypertension: Secondary | ICD-10-CM | POA: Insufficient documentation

## 2018-11-05 DIAGNOSIS — N138 Other obstructive and reflux uropathy: Secondary | ICD-10-CM | POA: Insufficient documentation

## 2018-11-05 DIAGNOSIS — N401 Enlarged prostate with lower urinary tract symptoms: Secondary | ICD-10-CM | POA: Insufficient documentation

## 2018-11-06 ENCOUNTER — Encounter: Payer: Self-pay | Admitting: Internal Medicine

## 2018-11-09 ENCOUNTER — Encounter: Payer: Self-pay | Admitting: Adult Health

## 2018-11-09 ENCOUNTER — Non-Acute Institutional Stay (SKILLED_NURSING_FACILITY): Payer: Medicare Other | Admitting: Adult Health

## 2018-11-09 DIAGNOSIS — I10 Essential (primary) hypertension: Secondary | ICD-10-CM

## 2018-11-09 DIAGNOSIS — G300 Alzheimer's disease with early onset: Secondary | ICD-10-CM | POA: Diagnosis not present

## 2018-11-09 DIAGNOSIS — F0281 Dementia in other diseases classified elsewhere with behavioral disturbance: Secondary | ICD-10-CM | POA: Diagnosis not present

## 2018-11-09 DIAGNOSIS — J3089 Other allergic rhinitis: Secondary | ICD-10-CM | POA: Diagnosis not present

## 2018-11-09 DIAGNOSIS — F02818 Dementia in other diseases classified elsewhere, unspecified severity, with other behavioral disturbance: Secondary | ICD-10-CM

## 2018-11-09 NOTE — Progress Notes (Signed)
Location:    [facility] Room Number: 157/W Place of Service:  SNF (31)   CODE STATUS: Full Code  No Known Allergies   Chief Complaint  Patient presents with  . Medical Management of Chronic Issues Alzheimer's dementia with behavioral disturbance early onsetHypothyroidism in adult:  Essential benign hypertension:   Weekly follow up for the first 30 days post hospitalization.       HPI:  He is a 78 year old short term rehab patient being seen for the management of his chronic illnesses; dementia; hypertension; hypothyroidism. He is walking in his room without assistance and needs reminding to ask for help. He states he has a good appetite and is sleeping well at night. There are no reports of uncontrolled pain present.   Past Medical History:  Diagnosis Date  . BPH (benign prostatic hyperplasia)   . Bradycardia   . Hyperlipemia   . Hypertension   . Hypotension   . Memory impairment     Past Surgical History:  Procedure Laterality Date  . PACEMAKER PLACEMENT      Social History   Socioeconomic History  . Marital status: Married    Spouse name: Not on file  . Number of children: Not on file  . Years of education: Not on file  . Highest education level: Not on file  Occupational History  . Not on file  Social Needs  . Financial resource strain: Not on file  . Food insecurity    Worry: Not on file    Inability: Not on file  . Transportation needs    Medical: Not on file    Non-medical: Not on file  Tobacco Use  . Smoking status: Never Smoker  . Smokeless tobacco: Never Used  Substance and Sexual Activity  . Alcohol use: No  . Drug use: No  . Sexual activity: Not on file  Lifestyle  . Physical activity    Days per week: Not on file    Minutes per session: Not on file  . Stress: Not on file  Relationships  . Social Musicianconnections    Talks on phone: Not on file    Gets together: Not on file    Attends religious service: Not on file     Active member of club or organization: Not on file    Attends meetings of clubs or organizations: Not on file    Relationship status: Not on file  . Intimate partner violence    Fear of current or ex partner: Not on file    Emotionally abused: Not on file    Physically abused: Not on file    Forced sexual activity: Not on file  Other Topics Concern  . Not on file  Social History Narrative  . Not on file   Family History  Problem Relation Age of Onset  . Diabetes Father   . Heart disease Father   . Heart disease Sister       VITAL SIGNS BP (!) 103/56   Pulse 65   Temp 98.1 F (36.7 C) (Oral)   Resp 18   Ht 5\' 8"  (1.727 m)   Wt 201 lb (91.2 kg)   BMI 30.56 kg/m   Outpatient Encounter Medications as of 11/09/2018  Medication Sig  . apixaban (ELIQUIS) 5 MG TABS tablet Take 1 tablet by mouth 2 (two) times daily.  Marland Kitchen. aspirin EC 81 MG tablet Take 81 mg by mouth daily.  . cyanocobalamin 1000 MCG tablet Take 1,000  mcg by mouth daily.  Marland Kitchen donepezil (ARICEPT) 10 MG tablet Take 1 tablet by mouth daily.  . feeding supplement, GLUCERNA SHAKE, (GLUCERNA SHAKE) LIQD Take 237 mLs by mouth daily.  Marland Kitchen levothyroxine (SYNTHROID) 50 MCG tablet Take 1 tablet by mouth daily.  Marland Kitchen loratadine (CLARITIN) 10 MG tablet Take 10 mg by mouth daily.  Marland Kitchen LORazepam (ATIVAN) 0.5 MG tablet Take 1 tablet (0.5 mg total) by mouth daily as needed for up to 10 days. anxiety  . losartan (COZAAR) 100 MG tablet Take 1 tablet by mouth daily.  . Memantine HCl-Donepezil HCl (NAMZARIC) 28-10 MG CP24 Take 1 capsule by mouth daily.  . NON FORMULARY Diet: Regular, NAS, Consistent Carbohydrate  . Potassium Chloride ER 20 MEQ TBCR Take 1 tablet by mouth daily.  . sertraline (ZOLOFT) 100 MG tablet Take 1 tablet by mouth daily.  . silodosin (RAPAFLO) 8 MG CAPS capsule Take 1 capsule by mouth daily.   No facility-administered encounter medications on file as of 11/09/2018.      SIGNIFICANT DIAGNOSTIC EXAMS  TODAY;    10-29-18: ct of head: Unremarkable CT appearance of the brain for age.   10-29-18: chest x-ray:  No active cardiopulmonary disease.   LABS REVIEWED TODAY;   10-29-18 wbc 9.1; hgb 13.0; hct 44.3; mcv 83.3 plt 223; glucose 85; bun 17; creat 1.49; k+ 3.8; na++ 136; ca 8.8; ast 110; albumin 4.0; mag 2.3; urine culture 30,000 colonies 10-30-18: vit B 12: 289; tsh 3.398 11-01-18: glucose 94; bun 19; creat 1.14; k+ 3.9; na++ 140; ca 8.7    Review of Systems  Constitutional: Negative for malaise/fatigue.  Respiratory: Negative for cough and shortness of breath.   Cardiovascular: Negative for chest pain, palpitations and leg swelling.  Gastrointestinal: Negative for abdominal pain, constipation and heartburn.  Musculoskeletal: Negative for back pain, joint pain and myalgias.  Skin: Negative.   Neurological: Negative for dizziness.  Psychiatric/Behavioral: The patient is not nervous/anxious.       Physical Exam Constitutional:      General: He is not in acute distress.    Appearance: He is well-developed. He is not diaphoretic.  Neck:     Musculoskeletal: Neck supple.     Thyroid: No thyromegaly.  Cardiovascular:     Rate and Rhythm: Normal rate and regular rhythm.     Pulses: Normal pulses.     Heart sounds: Normal heart sounds.     Comments: Pacemaker present  Pulmonary:     Effort: Pulmonary effort is normal. No respiratory distress.     Breath sounds: Normal breath sounds.  Abdominal:     General: Bowel sounds are normal. There is no distension.     Palpations: Abdomen is soft.     Tenderness: There is no abdominal tenderness.  Musculoskeletal: Normal range of motion.     Right lower leg: Edema present.     Left lower leg: Edema present.     Comments: Trace bilateral lower extremity edema   Lymphadenopathy:     Cervical: No cervical adenopathy.  Skin:    General: Skin is warm and dry.  Neurological:     Mental Status: He is alert. Mental status is at baseline.   Psychiatric:        Mood and Affect: Mood normal.      ASSESSMENT/ PLAN:  TODAY;   1. 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 Hypothyroidism in adult: is stable tsh 3.398 will continue synthroid 50 mcg daily   3. Essential benign hypertension: is stable b/p 103/56 will continue cozar 100 mg daily and asa 81 mg daily   PREVIOUS   4. Paroxysmal atrial fibrillation status post post maker insertion: heart rate is stable will continue long term anticoagulation: eliquis 5 mg twice daily   5. Parkinson's disease; no change in status; is a new diagnosis; is followed by neurology   6. 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   8. Hypokalemia: is stable k+ 3.9 will continue k+ 20 meq daily  9. BPH with urinary obstruction: is stable will continue rapaflo 8 mg daily   10. Vitamin B 12 deficiency: stable : b12: 289 will continue1000 mcg daily         MD is aware of resident's narcotic use and is in agreement with current plan of care. We will attempt to wean resident as appropriate.  Synthia Innocent NP [facility]  Contact [phone] Monday through Friday 8am- 5pm  After hours call [phone]

## 2018-11-16 ENCOUNTER — Encounter: Payer: Self-pay | Admitting: Adult Health

## 2018-11-16 ENCOUNTER — Non-Acute Institutional Stay (SKILLED_NURSING_FACILITY): Payer: Medicare Other | Admitting: Adult Health

## 2018-11-16 DIAGNOSIS — J3089 Other allergic rhinitis: Secondary | ICD-10-CM

## 2018-11-16 DIAGNOSIS — I480 Paroxysmal atrial fibrillation: Secondary | ICD-10-CM | POA: Diagnosis not present

## 2018-11-16 DIAGNOSIS — G20 Parkinson's disease: Secondary | ICD-10-CM

## 2018-11-16 NOTE — Progress Notes (Signed)
Location:    [facility] Room Number: 157/W Place of Service:  SNF (31)   CODE STATUS: Full Code  No Known Allergies  Chief Complaint  Patient presents with  . Medical Management of Chronic Issues        Paroxsymal atrial fibrillation Parkinson's disease;   Chronic non-seasonal allergic rhinitis:  Weekly follow up for the first 30 days post hospitalization.     HPI:  He is a 78 year old short term rehab patient being seen for the management of his chronic illnesses: afib; allergies; parkinson disease. He denies any uncontrolled pain; on sinus congestion; no weakness; no changes in appetite.   Past Medical History:  Diagnosis Date  . BPH (benign prostatic hyperplasia)   . Bradycardia   . Hyperlipemia   . Hypertension   . Hypotension   . Memory impairment     Past Surgical History:  Procedure Laterality Date  . PACEMAKER PLACEMENT      Social History   Socioeconomic History  . Marital status: Married    Spouse name: Not on file  . Number of children: Not on file  . Years of education: Not on file  . Highest education level: Not on file  Occupational History  . Not on file  Social Needs  . Financial resource strain: Not on file  . Food insecurity    Worry: Not on file    Inability: Not on file  . Transportation needs    Medical: Not on file    Non-medical: Not on file  Tobacco Use  . Smoking status: Never Smoker  . Smokeless tobacco: Never Used  Substance and Sexual Activity  . Alcohol use: No  . Drug use: No  . Sexual activity: Not on file  Lifestyle  . Physical activity    Days per week: Not on file    Minutes per session: Not on file  . Stress: Not on file  Relationships  . Social Musicianconnections    Talks on phone: Not on file    Gets together: Not on file    Attends religious service: Not on file    Active member of club or organization: Not on file    Attends meetings of clubs or organizations: Not on file    Relationship  status: Not on file  . Intimate partner violence    Fear of current or ex partner: Not on file    Emotionally abused: Not on file    Physically abused: Not on file    Forced sexual activity: Not on file  Other Topics Concern  . Not on file  Social History Narrative  . Not on file   Family History  Problem Relation Age of Onset  . Diabetes Father   . Heart disease Father   . Heart disease Sister       VITAL SIGNS BP 121/68   Pulse 67   Temp (!) 97.2 F (36.2 C) (Oral)   Resp 20   Ht 5\' 8"  (1.727 m)   Wt 199 lb 12.8 oz (90.6 kg)   BMI 30.38 kg/m   Outpatient Encounter Medications as of 11/16/2018  Medication Sig  . apixaban (ELIQUIS) 5 MG TABS tablet Take 1 tablet by mouth 2 (two) times daily.  Marland Kitchen. aspirin EC 81 MG tablet Take 81 mg by mouth daily.  . cyanocobalamin 1000 MCG tablet Take 1,000 mcg by mouth daily.  Marland Kitchen. donepezil (ARICEPT) 10 MG tablet Take 1 tablet by mouth daily.  .Marland Kitchen  feeding supplement, GLUCERNA SHAKE, (GLUCERNA SHAKE) LIQD Take 237 mLs by mouth daily.  Marland Kitchen levothyroxine (SYNTHROID) 50 MCG tablet Take 1 tablet by mouth daily.  Marland Kitchen loratadine (CLARITIN) 10 MG tablet Take 10 mg by mouth daily.  Marland Kitchen losartan (COZAAR) 100 MG tablet Take 1 tablet by mouth daily.  . Memantine HCl-Donepezil HCl (NAMZARIC) 28-10 MG CP24 Take 1 capsule by mouth daily.  . NON FORMULARY Diet: Regular, NAS, Consistent Carbohydrate  . Potassium Chloride ER 20 MEQ TBCR Take 1 tablet by mouth daily.  . sertraline (ZOLOFT) 100 MG tablet Take 1 tablet by mouth daily.  . silodosin (RAPAFLO) 8 MG CAPS capsule Take 1 capsule by mouth daily.   No facility-administered encounter medications on file as of 11/16/2018.      SIGNIFICANT DIAGNOSTIC EXAMS  PREVIOUS;   10-29-18: ct of head: Unremarkable CT appearance of the brain for age.   10-29-18: chest x-ray:  No active cardiopulmonary disease.   NO NEW EXAMS.   LABS REVIEWED PREVIOUS;   10-29-18 wbc 9.1; hgb 13.0; hct 44.3; mcv 83.3 plt 223;  glucose 85; bun 17; creat 1.49; k+ 3.8; na++ 136; ca 8.8; ast 110; albumin 4.0; mag 2.3; urine culture 30,000 colonies 10-30-18: vit B 12: 289; tsh 3.398 11-01-18: glucose 94; bun 19; creat 1.14; k+ 3.9; na++ 140; ca 8.7   NO NEW LABS.   Review of Systems  Constitutional: Negative for malaise/fatigue.  Respiratory: Negative for cough and shortness of breath.   Cardiovascular: Negative for chest pain, palpitations and leg swelling.  Gastrointestinal: Negative for abdominal pain, constipation and heartburn.  Musculoskeletal: Negative for back pain, joint pain and myalgias.  Skin: Negative.   Neurological: Negative for dizziness.  Psychiatric/Behavioral: The patient is not nervous/anxious.     Physical Exam Constitutional:      General: He is not in acute distress.    Appearance: He is well-developed. He is not diaphoretic.  Neck:     Musculoskeletal: Neck supple.     Thyroid: No thyromegaly.  Cardiovascular:     Rate and Rhythm: Normal rate and regular rhythm.     Pulses: Normal pulses.     Heart sounds: Normal heart sounds.     Comments: Pace maker Pulmonary:     Effort: Pulmonary effort is normal. No respiratory distress.     Breath sounds: Normal breath sounds.  Abdominal:     General: Bowel sounds are normal. There is no distension.     Palpations: Abdomen is soft.     Tenderness: There is no abdominal tenderness.  Musculoskeletal: Normal range of motion.     Right lower leg: Edema present.     Left lower leg: Edema present.     Comments: Trace bilateral lower extremity edema   Lymphadenopathy:     Cervical: No cervical adenopathy.  Skin:    General: Skin is warm and dry.  Neurological:     Mental Status: He is alert. Mental status is at baseline.  Psychiatric:        Mood and Affect: Mood normal.       ASSESSMENT/ PLAN:  TODAY;   1. Paroxsymal atrial fibrillation status post pace maker insertion: heart rate is stable will continue long term eliquis 5 mg twice  daily   2. Parkinson's disease; without change will monitor   3. Chronic non-seasonal allergic rhinitis: is stable will continue claritin 10 mg daily   PREVIOUS   4. Depression due to dementia: is stable will continue zoloft 100 mg daily ativan  was stopped for non-use    5. Hypokalemia: is stable k+ 3.9 will continue k+ 20 meq daily  6. BPH with urinary obstruction: is stable will continue rapaflo 8 mg daily   7. Vitamin B 12 deficiency: stable : b12: 289 will continue1000 mcg daily   8. 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 Essential benign hypertension: is stable b/p 121/68 will continue cozar 100 mg daily and asa 81 mg daily         MD is aware of resident's narcotic use and is in agreement with current plan of care. We will attempt to wean resident as appropriate.  Synthia Innocent NP [facility]  Contact [phone] Monday through Friday 8am- 5pm  After hours call [phone]

## 2018-11-18 ENCOUNTER — Encounter: Payer: Self-pay | Admitting: Adult Health

## 2018-11-18 ENCOUNTER — Non-Acute Institutional Stay (SKILLED_NURSING_FACILITY): Payer: Medicare Other | Admitting: Adult Health

## 2018-11-18 ENCOUNTER — Other Ambulatory Visit: Payer: Self-pay | Admitting: Adult Health

## 2018-11-18 DIAGNOSIS — R531 Weakness: Secondary | ICD-10-CM | POA: Diagnosis not present

## 2018-11-18 DIAGNOSIS — I480 Paroxysmal atrial fibrillation: Secondary | ICD-10-CM

## 2018-11-18 DIAGNOSIS — R5381 Other malaise: Secondary | ICD-10-CM | POA: Diagnosis not present

## 2018-11-18 DIAGNOSIS — F0281 Dementia in other diseases classified elsewhere with behavioral disturbance: Secondary | ICD-10-CM

## 2018-11-18 DIAGNOSIS — N179 Acute kidney failure, unspecified: Secondary | ICD-10-CM

## 2018-11-18 DIAGNOSIS — F02818 Dementia in other diseases classified elsewhere, unspecified severity, with other behavioral disturbance: Secondary | ICD-10-CM

## 2018-11-18 DIAGNOSIS — G300 Alzheimer's disease with early onset: Secondary | ICD-10-CM | POA: Diagnosis not present

## 2018-11-18 MED ORDER — NAMZARIC 28-10 MG PO CP24: 1.0000 | ORAL_CAPSULE | Freq: Every day | ORAL | 0 refills | Status: AC

## 2018-11-18 MED ORDER — LOSARTAN POTASSIUM 100 MG PO TABS: 100.0000 mg | ORAL_TABLET | Freq: Every day | ORAL | 0 refills | Status: AC

## 2018-11-18 MED ORDER — APIXABAN 5 MG PO TABS: 5.0000 mg | ORAL_TABLET | Freq: Two times a day (BID) | ORAL | 0 refills | Status: AC

## 2018-11-18 MED ORDER — SERTRALINE HCL 100 MG PO TABS: 100.0000 mg | ORAL_TABLET | Freq: Every day | ORAL | 0 refills | Status: AC

## 2018-11-18 MED ORDER — POTASSIUM CHLORIDE ER 20 MEQ PO TBCR: 1.0000 | EXTENDED_RELEASE_TABLET | Freq: Every day | ORAL | 0 refills | Status: DC

## 2018-11-18 MED ORDER — DONEPEZIL HCL 10 MG PO TABS: 10.0000 mg | ORAL_TABLET | Freq: Every day | ORAL | 0 refills | Status: DC

## 2018-11-18 MED ORDER — SILODOSIN 8 MG PO CAPS: 8.0000 mg | ORAL_CAPSULE | Freq: Every day | ORAL | 0 refills | Status: AC

## 2018-11-18 MED ORDER — LEVOTHYROXINE SODIUM 50 MCG PO TABS: 50.0000 ug | ORAL_TABLET | Freq: Every day | ORAL | 0 refills | Status: AC

## 2018-11-18 NOTE — Progress Notes (Signed)
Location:    [facility] Room Number: 143/P Place of Service:  SNF (31)    CODE STATUS: Full Code  No Known Allergies  Chief Complaint  Patient presents with  . Discharge Note     HPI:  He is being discharged to home with home health for pt/ot/rn/cna. He will not need dme. He will need to follow up with his medical provider and will need to have his prescriptions written. He had been hospitalized for weakness and was admitted to this facility for short term rehab. He has participated in therapy and is ready to complete his therapy on a home health basis.     Past Medical History:  Diagnosis Date  . BPH (benign prostatic hyperplasia)   . Bradycardia   . Hyperlipemia   . Hypertension   . Hypotension   . Memory impairment     Past Surgical History:  Procedure Laterality Date  . PACEMAKER PLACEMENT      Social History   Socioeconomic History  . Marital status: Married    Spouse name: Not on file  . Number of children: Not on file  . Years of education: Not on file  . Highest education level: Not on file  Occupational History  . Not on file  Social Needs  . Financial resource strain: Not on file  . Food insecurity    Worry: Not on file    Inability: Not on file  . Transportation needs    Medical: Not on file    Non-medical: Not on file  Tobacco Use  . Smoking status: Never Smoker  . Smokeless tobacco: Never Used  Substance and Sexual Activity  . Alcohol use: No  . Drug use: No  . Sexual activity: Not on file  Lifestyle  . Physical activity    Days per week: Not on file    Minutes per session: Not on file  . Stress: Not on file  Relationships  . Social Musicianconnections    Talks on phone: Not on file    Gets together: Not on file    Attends religious service: Not on file    Active member of club or organization: Not on file    Attends meetings of clubs or organizations: Not on file    Relationship status: Not on file  . Intimate  partner violence    Fear of current or ex partner: Not on file    Emotionally abused: Not on file    Physically abused: Not on file    Forced sexual activity: Not on file  Other Topics Concern  . Not on file  Social History Narrative  . Not on file   Family History  Problem Relation Age of Onset  . Diabetes Father   . Heart disease Father   . Heart disease Sister     VITAL SIGNS BP (!) 181/85   Pulse 78   Temp 98.6 F (37 C) (Oral)   Resp 20   Ht 5\' 8"  (1.727 m)   Wt 199 lb 12.8 oz (90.6 kg)   BMI 30.38 kg/m   Patient's Medications  New Prescriptions   No medications on file  Previous Medications   APIXABAN (ELIQUIS) 5 MG TABS TABLET    Take 1 tablet by mouth 2 (two) times daily.   ASPIRIN EC 81 MG TABLET    Take 81 mg by mouth daily.   CYANOCOBALAMIN 1000 MCG TABLET    Take 1,000 mcg by mouth daily.  DONEPEZIL (ARICEPT) 10 MG TABLET    Take 1 tablet by mouth daily.   FEEDING SUPPLEMENT, GLUCERNA SHAKE, (GLUCERNA SHAKE) LIQD    Take 237 mLs by mouth daily.   LEVOTHYROXINE (SYNTHROID) 50 MCG TABLET    Take 1 tablet by mouth daily.   LORATADINE (CLARITIN) 10 MG TABLET    Take 10 mg by mouth daily.   LOSARTAN (COZAAR) 100 MG TABLET    Take 1 tablet by mouth daily.   MEMANTINE HCL-DONEPEZIL HCL (NAMZARIC) 28-10 MG CP24    Take 1 capsule by mouth daily.   NON FORMULARY    Diet: Regular, NAS, Consistent Carbohydrate   POTASSIUM CHLORIDE ER 20 MEQ TBCR    Take 1 tablet by mouth daily.   SERTRALINE (ZOLOFT) 100 MG TABLET    Take 1 tablet by mouth daily.   SILODOSIN (RAPAFLO) 8 MG CAPS CAPSULE    Take 1 capsule by mouth daily.  Modified Medications   No medications on file  Discontinued Medications   No medications on file     SIGNIFICANT DIAGNOSTIC EXAMS   PREVIOUS;   10-29-18: ct of head: Unremarkable CT appearance of the brain for age.   10-29-18: chest x-ray:  No active cardiopulmonary disease.   NO NEW EXAMS.   LABS REVIEWED PREVIOUS;   10-29-18 wbc 9.1; hgb  13.0; hct 44.3; mcv 83.3 plt 223; glucose 85; bun 17; creat 1.49; k+ 3.8; na++ 136; ca 8.8; ast 110; albumin 4.0; mag 2.3; urine culture 30,000 colonies 10-30-18: vit B 12: 289; tsh 3.398 11-01-18: glucose 94; bun 19; creat 1.14; k+ 3.9; na++ 140; ca 8.7   NO NEW LABS.    Review of Systems  Constitutional: Negative for malaise/fatigue.  Respiratory: Negative for cough and shortness of breath.   Cardiovascular: Negative for chest pain, palpitations and leg swelling.  Gastrointestinal: Negative for abdominal pain, constipation and heartburn.  Musculoskeletal: Negative for back pain, joint pain and myalgias.  Skin: Negative.   Neurological: Negative for dizziness.  Psychiatric/Behavioral: The patient is not nervous/anxious.     Physical Exam Constitutional:      General: He is not in acute distress.    Appearance: He is well-developed. He is not diaphoretic.  Neck:     Musculoskeletal: Neck supple.     Thyroid: No thyromegaly.  Cardiovascular:     Rate and Rhythm: Normal rate and regular rhythm.     Pulses: Normal pulses.     Heart sounds: Normal heart sounds.     Comments: Pace maker  Pulmonary:     Effort: Pulmonary effort is normal. No respiratory distress.     Breath sounds: Normal breath sounds.  Abdominal:     General: Bowel sounds are normal. There is no distension.     Palpations: Abdomen is soft.     Tenderness: There is no abdominal tenderness.  Musculoskeletal: Normal range of motion.     Right lower leg: Edema present.     Left lower leg: Edema present.     Comments: Trace bilateral lower extremity edema    Lymphadenopathy:     Cervical: No cervical adenopathy.  Skin:    General: Skin is warm and dry.  Neurological:     Mental Status: He is alert. Mental status is at baseline.  Psychiatric:        Mood and Affect: Mood normal.      ASSESSMENT/ PLAN:   Patient is being discharged with the following home health services:  Pt/ot/rn/cna: to evaluate and  treat as indicated for gait balance strength adl training medication management and adl care.   Patient is being discharged with the following durable medical equipment:  None needed   Patient has been advised to f/u with their PCP in 1-2 weeks to bring them up to date on their rehab stay.  Social services at facility was responsible for arranging this appointment.  Pt was provided with a 30 day supply of prescriptions for medications and refills must be obtained from their PCP.  For controlled substances, a more limited supply may be provided adequate until PCP appointment only.  A 30 day supply of his prescription medications have been sent to [facility] pharmacy in [place]  Time spent with patient 35 minutes home health; medications dme.     Synthia Innocent NP [facility]  Contact [phone] Monday through Friday 8am- 5pm  After hours call [phone]

## 2018-11-22 ENCOUNTER — Encounter (HOSPITAL_COMMUNITY): Payer: Self-pay

## 2018-11-22 ENCOUNTER — Emergency Department (HOSPITAL_COMMUNITY): Payer: Medicare Other

## 2018-11-22 ENCOUNTER — Other Ambulatory Visit: Payer: Self-pay

## 2018-11-22 ENCOUNTER — Other Ambulatory Visit: Payer: Self-pay | Admitting: Adult Health

## 2018-11-22 ENCOUNTER — Emergency Department (HOSPITAL_COMMUNITY)
Admission: EM | Admit: 2018-11-22 | Discharge: 2018-11-22 | Disposition: A | Discharge status: Home / Self Care | Payer: Medicare Other | Attending: Emergency Medicine | Admitting: Emergency Medicine

## 2018-11-22 DIAGNOSIS — G20 Parkinson's disease: Secondary | ICD-10-CM | POA: Insufficient documentation

## 2018-11-22 DIAGNOSIS — Z950 Presence of cardiac pacemaker: Secondary | ICD-10-CM | POA: Insufficient documentation

## 2018-11-22 DIAGNOSIS — I480 Paroxysmal atrial fibrillation: Secondary | ICD-10-CM | POA: Diagnosis not present

## 2018-11-22 DIAGNOSIS — M6281 Muscle weakness (generalized): Secondary | ICD-10-CM | POA: Insufficient documentation

## 2018-11-22 DIAGNOSIS — R531 Weakness: Secondary | ICD-10-CM

## 2018-11-22 DIAGNOSIS — I10 Essential (primary) hypertension: Secondary | ICD-10-CM | POA: Diagnosis not present

## 2018-11-22 DIAGNOSIS — E039 Hypothyroidism, unspecified: Secondary | ICD-10-CM | POA: Diagnosis not present

## 2018-11-22 DIAGNOSIS — F0281 Dementia in other diseases classified elsewhere with behavioral disturbance: Secondary | ICD-10-CM | POA: Diagnosis not present

## 2018-11-22 DIAGNOSIS — Z79899 Other long term (current) drug therapy: Secondary | ICD-10-CM | POA: Insufficient documentation

## 2018-11-22 DIAGNOSIS — G309 Alzheimer's disease, unspecified: Secondary | ICD-10-CM | POA: Insufficient documentation

## 2018-11-22 DIAGNOSIS — Z7982 Long term (current) use of aspirin: Secondary | ICD-10-CM | POA: Insufficient documentation

## 2018-11-22 DIAGNOSIS — Z7901 Long term (current) use of anticoagulants: Secondary | ICD-10-CM | POA: Insufficient documentation

## 2018-11-22 DIAGNOSIS — R519 Headache, unspecified: Secondary | ICD-10-CM | POA: Diagnosis present

## 2018-11-22 LAB — URINALYSIS, ROUTINE W REFLEX MICROSCOPIC
Bilirubin Urine: NEGATIVE
Glucose, UA: NEGATIVE mg/dL
Hgb urine dipstick: NEGATIVE
Ketones, ur: NEGATIVE mg/dL
Leukocytes,Ua: NEGATIVE
Nitrite: NEGATIVE
Protein, ur: NEGATIVE mg/dL
Specific Gravity, Urine: 1.019 (ref 1.005–1.030)
pH: 6 (ref 5.0–8.0)

## 2018-11-22 LAB — CBC
HCT: 35.4 % — ABNORMAL LOW (ref 39.0–52.0)
Hemoglobin: 10.5 g/dL — ABNORMAL LOW (ref 13.0–17.0)
MCH: 24.5 pg — ABNORMAL LOW (ref 26.0–34.0)
MCHC: 29.7 g/dL — ABNORMAL LOW (ref 30.0–36.0)
MCV: 82.7 fL (ref 80.0–100.0)
Platelets: 278 10*3/uL (ref 150–400)
RBC: 4.28 MIL/uL (ref 4.22–5.81)
RDW: 13.7 % (ref 11.5–15.5)
WBC: 8.9 10*3/uL (ref 4.0–10.5)
nRBC: 0 % (ref 0.0–0.2)

## 2018-11-22 LAB — COMPREHENSIVE METABOLIC PANEL
ALT: 16 U/L (ref 0–44)
AST: 20 U/L (ref 15–41)
Albumin: 3.5 g/dL (ref 3.5–5.0)
Alkaline Phosphatase: 68 U/L (ref 38–126)
Anion gap: 6 (ref 5–15)
BUN: 14 mg/dL (ref 8–23)
CO2: 27 mmol/L (ref 22–32)
Calcium: 8.7 mg/dL — ABNORMAL LOW (ref 8.9–10.3)
Chloride: 107 mmol/L (ref 98–111)
Creatinine, Ser: 1.25 mg/dL — ABNORMAL HIGH (ref 0.61–1.24)
GFR calc Af Amer: >60 mL/min (ref >60)
GFR calc non Af Amer: 54 mL/min — ABNORMAL LOW (ref >60)
Glucose, Bld: 90 mg/dL (ref 70–99)
Potassium: 3.9 mmol/L (ref 3.5–5.1)
Sodium: 140 mmol/L (ref 135–145)
Total Bilirubin: 0.4 mg/dL (ref 0.3–1.2)
Total Protein: 6.7 g/dL (ref 6.5–8.1)

## 2018-11-22 LAB — CBG MONITORING, ED: Glucose-Capillary: 78 mg/dL (ref 70–99)

## 2018-11-22 MED ORDER — SODIUM CHLORIDE 0.9 % IV BOLUS
1000.0000 mL | Freq: Once | INTRAVENOUS | Status: AC
  Administered 2018-11-22: 1000 mL via INTRAVENOUS

## 2018-11-22 NOTE — ED Triage Notes (Addendum)
Ems reports pt's wife noticed pt's face was drooping and he was drooling.  Says pt was trying to drink something and it was running out of his mouth.  CBG 109,  vss. Spoke with pt's wife over the phone.  She said she noticed his mouth was "twisted" around 3am.  Reports pt's mouth wasn't twisted when he went to bed around 1930.  Reports pt has been leaning to left side since he came home and noticed he was drooling some Sunday.  Pt says he had a  "slight" headache that started today around 0900.  Pt presently alert and oriented.

## 2018-11-22 NOTE — ED Notes (Signed)
Pt. Stated he was unable to stand for orthostatic vitals.

## 2018-11-22 NOTE — Discharge Instructions (Addendum)
It was our pleasure to provide your ER care today - we hope that you feel better.  Follow up closely with your primary care doctor, and neurologist as outpatient - call office to arrange appointment.  Coordinate with your primary care doctor and home health agency to maximize your home health care resources, and to discuss long term plan of care (whether in the home, at at extended care facility).   Return to ER if worse, new symptoms, fevers, new or severe pain, trouble breathing, or other concern.

## 2018-11-22 NOTE — ED Provider Notes (Addendum)
[facility] EMERGENCY DEPARTMENT Provider Note   CSN: [id number] Arrival date & time: 11/22/18  1230     History   Chief Complaint Chief Complaint  Patient presents with   Headache    HPI Lenon AhmadiClarence E Mandell is a 78 y.o. male.     Patient with hx dementia, Parkinsons, afib/pacemaker,  c/o feeling lightheaded when stands in the past week. Symptoms gradual onset, moderate, persistent, worse when standing. Per EMS, spouse also noted that when patient awoke around 3 AM to get something to drink he seemed to drool and that mouth appeared twisted. When pt went to bed around 730 last pm, she felt he was at baseline. This AM patient notes slight frontal headache. Patient denies syncope, trauma or fall. No vertigo, unsteady gait, or tinnitus. No change in speech or vision. Denies any focal or unilateral numbness or weakness. No chest pain or discomfort. No sob. No abd pain or nvd. No gu c/o. Denies change in meds or new meds.   The history is provided by the patient and the EMS personnel.  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   Hypokalemia 11/05/2018   BPH with urinary obstruction 11/05/2018   Presence of cardiac pacemaker 11/05/2018   Vitamin B 12 deficiency 11/05/2018   AKI (acute kidney injury) (HCC)    Atrial fibrillation, chronic (HCC)    Alzheimer's dementia with behavioral disturbance (HCC)     Generalized weakness 10/29/2018   Physical deconditioning 10/29/2018   Dementia without behavioral disturbance (HCC) 10/29/2018   Parkinson disease (HCC) 10/29/2018   Bradycardia 10/29/2018   Debility 10/29/2018    Past Surgical History:  Procedure Laterality Date   PACEMAKER PLACEMENT          Home Medications    Prior to Admission medications   Medication Sig Start Date End Date Taking? Authorizing Provider  apixaban (ELIQUIS) 5 MG TABS tablet Take 1 tablet (5 mg total) by mouth 2 (two) times daily. 11/18/18   Sharee HolsterGreen, Deborah S, NP  aspirin EC 81 MG tablet Take 81 mg by mouth daily.    [provider]  cyanocobalamin 1000 MCG tablet Take 1,000 mcg by mouth daily.    [provider]  donepezil (ARICEPT) 10 MG tablet Take 1 tablet (10 mg total) by mouth daily. 11/18/18   Sharee HolsterGreen, Deborah S, NP  feeding supplement, GLUCERNA SHAKE, (GLUCERNA SHAKE) LIQD Take 237 mLs by mouth daily.    [provider]  levothyroxine (SYNTHROID) 50 MCG tablet Take 1 tablet (50 mcg total) by mouth daily. 11/18/18   Sharee HolsterGreen, Deborah S, NP  loratadine (CLARITIN) 10 MG tablet Take 10 mg by mouth daily.    [provider]  losartan (COZAAR) 100 MG tablet Take 1 tablet (100 mg total) by mouth daily. 11/18/18   Sharee HolsterGreen, Deborah S, NP  Memantine HCl-Donepezil HCl (NAMZARIC) 28-10 MG CP24 Take 1 capsule by mouth daily. 11/18/18  Gerlene Fee, NP  NON FORMULARY Diet: Regular, NAS, Consistent Carbohydrate    [provider]  Potassium Chloride ER 20 MEQ TBCR Take 1 tablet by mouth daily. 11/18/18   Gerlene Fee, NP  sertraline (ZOLOFT) 100 MG tablet Take 1 tablet (100 mg total) by mouth daily. 11/18/18   Gerlene Fee, NP  silodosin (RAPAFLO) 8 MG CAPS capsule Take 1 capsule (8 mg total) by mouth daily. 11/18/18   Gerlene Fee, NP    Family History Family History  Problem Relation Age of Onset   Diabetes Father    Heart disease Father    Heart disease  Sister     Social History Social History   Tobacco Use   Smoking status: Never Smoker   Smokeless tobacco: Never Used  Substance Use Topics   Alcohol use: No   Drug use: No     Allergies   Patient has no known allergies.   Review of Systems Review of Systems  Constitutional: Negative for chills and fever.  HENT: Negative for sore throat.   Eyes: Negative for visual disturbance.  Respiratory: Negative for cough and shortness of breath.   Cardiovascular: Negative for chest pain and leg swelling.  Gastrointestinal: Negative for abdominal pain, blood in stool, diarrhea and vomiting.  Endocrine: Negative for polyuria.  Genitourinary: Negative for dysuria and flank pain.  Musculoskeletal: Negative for back pain and neck pain.  Skin: Negative for rash.  Neurological: Positive for light-headedness and headaches. Negative for speech difficulty and numbness.  Hematological: Does not bruise/bleed easily.  Psychiatric/Behavioral: Negative for confusion.     Physical Exam Updated Vital Signs BP (!) 152/65 (BP Location: Left Arm)    Pulse 62    Temp 97.9 F (36.6 C) (Oral)    Resp 16    Ht 1.727 m (5\' 8" )    Wt 90 kg    SpO2 100%    BMI 30.17 kg/m   Physical Exam Vitals signs and nursing note reviewed.  Constitutional:      Appearance: Normal appearance. He is well-developed.  HENT:     Head: Atraumatic.     Comments: No sinus or temporal tenderness.     Right Ear: Tympanic membrane normal.     Left Ear: Tympanic membrane normal.     Nose: Nose normal.     Mouth/Throat:     Mouth: Mucous membranes are moist.     Pharynx: Oropharynx is clear.  Eyes:     General: No scleral icterus.    Extraocular Movements: Extraocular movements intact.     Conjunctiva/sclera: Conjunctivae normal.     Pupils: Pupils are equal, round, and reactive to light.  Neck:     Musculoskeletal: Normal range of motion and neck supple. No neck rigidity.     Vascular: No carotid bruit.      Trachea: No tracheal deviation.  Cardiovascular:     Rate and Rhythm: Normal rate and regular rhythm.     Pulses: Normal pulses.     Heart sounds: Normal heart sounds. No murmur. No friction rub. No gallop.   Pulmonary:     Effort: Pulmonary effort is normal. No accessory muscle usage or respiratory distress.     Breath sounds: Normal breath sounds.  Abdominal:     General: Bowel sounds are normal. There is no distension.     Palpations: Abdomen is soft. There is no mass.     Tenderness: There is no abdominal tenderness. There is no guarding or  rebound.     Hernia: No hernia is present.  Genitourinary:    Comments: No cva tenderness. Light brown stool, heme neg.  Musculoskeletal:        General: No swelling or tenderness.     Right lower leg: No edema.     Left lower leg: No edema.  Skin:    General: Skin is warm and dry.     Findings: No rash.  Neurological:     General: No focal deficit present.     Mental Status: He is alert.     Cranial Nerves: No cranial nerve deficit.     Comments: Alert, speech quiet, but clear/fluent. No facial asymmetry or droop. Motor intact bil, stre 5/5. No pronator drift. sens grossly intact bil.   Psychiatric:        Mood and Affect: Mood normal.      ED Treatments / Results  Labs (all labs ordered are listed, but only abnormal results are displayed) Results for orders placed or performed during the hospital encounter of 11/22/18  CBC  Result Value Ref Range   WBC 8.9 4.0 - 10.5 K/uL   RBC 4.28 4.22 - 5.81 MIL/uL   Hemoglobin 10.5 (L) 13.0 - 17.0 g/dL   HCT 91.4 (L) 78.2 - 95.6 %   MCV 82.7 80.0 - 100.0 fL   MCH 24.5 (L) 26.0 - 34.0 pg   MCHC 29.7 (L) 30.0 - 36.0 g/dL   RDW 21.3 08.6 - 57.8 %   Platelets 278 150 - 400 K/uL   nRBC 0.0 0.0 - 0.2 %  Comprehensive metabolic panel  Result Value Ref Range   Sodium 140 135 - 145 mmol/L   Potassium 3.9 3.5 - 5.1 mmol/L   Chloride 107 98 - 111 mmol/L   CO2 27 22 - 32 mmol/L   Glucose, Bld  90 70 - 99 mg/dL   BUN 14 8 - 23 mg/dL   Creatinine, Ser 4.69 (H) 0.61 - 1.24 mg/dL   Calcium 8.7 (L) 8.9 - 10.3 mg/dL   Total Protein 6.7 6.5 - 8.1 g/dL   Albumin 3.5 3.5 - 5.0 g/dL   AST 20 15 - 41 U/L   ALT 16 0 - 44 U/L   Alkaline Phosphatase 68 38 - 126 U/L   Total Bilirubin 0.4 0.3 - 1.2 mg/dL   GFR calc non Af Amer 54 (L) >60 mL/min   GFR calc Af Amer >60 >60 mL/min   Anion gap 6 5 - 15  Urinalysis, Routine w reflex microscopic  Result Value Ref Range   Color, Urine YELLOW YELLOW   APPearance CLEAR CLEAR   Specific Gravity, Urine 1.019 1.005 - 1.030   pH 6.0 5.0 - 8.0   Glucose, UA NEGATIVE NEGATIVE mg/dL   Hgb urine dipstick NEGATIVE NEGATIVE   Bilirubin Urine NEGATIVE NEGATIVE   Ketones, ur NEGATIVE NEGATIVE mg/dL   Protein, ur NEGATIVE NEGATIVE mg/dL   Nitrite NEGATIVE NEGATIVE   Leukocytes,Ua NEGATIVE NEGATIVE  CBG monitoring, ED  Result Value Ref Range   Glucose-Capillary 78 70 - 99 mg/dL   Dg Chest 2 View  Result Date: 10/29/2018 CLINICAL DATA:  Weakness EXAM: CHEST - 2 VIEW COMPARISON:  05/17/2015 FINDINGS: There is no focal consolidation. There is no pleural effusion or pneumothorax. The heart and mediastinal contours are unremarkable. There is a dual lead cardiac pacemaker. There is no acute osseous abnormality. IMPRESSION: No active cardiopulmonary disease. Electronically Signed   By: Elige Ko   On: 10/29/2018 18:27  Ct Head Wo Contrast  Result Date: 11/22/2018 CLINICAL DATA:  Altered level of consciousness with leaning toward left side EXAM: CT HEAD WITHOUT CONTRAST TECHNIQUE: Contiguous axial images were obtained from the base of the skull through the vertex without intravenous contrast. COMPARISON:  October 29, 2018 FINDINGS: Brain: Mild diffuse atrophy is stable. There is no intracranial mass, hemorrhage, extra-axial fluid collection, or midline shift. There is slight small vessel disease in the centra semiovale bilaterally, stable. No acute infarct is  demonstrable. Vascular: There is no hyperdense vessel. There are foci of calcification in the carotid siphon regions, more on the right than on the left. Skull: Bony calvarium appears intact. Sinuses/Orbits: There is mucosal thickening in several ethmoid air cells. There is mild mucosal thickening in the anterior superior right maxillary antrum. Orbits appear symmetric bilaterally. Other: Mastoid air cells are clear. IMPRESSION: Stable mild atrophy with slight periventricular small vessel disease. No mass or hemorrhage. No acute infarct. There are foci of arterial vascular calcification. There are areas of mild paranasal sinus disease. Electronically Signed   By: Bretta Bang III M.D.   On: 11/22/2018 13:40   Ct Head Wo Contrast  Result Date: 10/29/2018 CLINICAL DATA:  Generalized muscle weakness. EXAM: CT HEAD WITHOUT CONTRAST TECHNIQUE: Contiguous axial images were obtained from the base of the skull through the vertex without intravenous contrast. COMPARISON:  08/23/2018 FINDINGS: Brain: There is no evidence of acute infarct, intracranial hemorrhage, mass, midline shift, or extra-axial fluid collection. Mild cerebral atrophy is unchanged and within normal limits for age. Vascular: No hyperdense vessel. Skull: No fracture or focal osseous lesion. Sinuses/Orbits: Visualized paranasal sinuses and mastoid air cells are clear. Bilateral cataract extraction is noted. Other: None. IMPRESSION: Unremarkable CT appearance of the brain for age. Electronically Signed   By: Sebastian Ache M.D.   On: 10/29/2018 18:09   ED ECG REPORT   Date: 11/22/2018  Rate: 60  Rhythm: normal sinus rhythm  QRS Axis: normal  Intervals: normal  ST/T Wave abnormalities: normal  Conduction Disutrbances:none  Narrative Interpretation:   Old EKG Reviewed: changes noted  I have personally reviewed the EKG tracing    Radiology Ct Head Wo Contrast  Result Date: 11/22/2018 CLINICAL DATA:  Altered level of consciousness with  leaning toward left side EXAM: CT HEAD WITHOUT CONTRAST TECHNIQUE: Contiguous axial images were obtained from the base of the skull through the vertex without intravenous contrast. COMPARISON:  October 29, 2018 FINDINGS: Brain: Mild diffuse atrophy is stable. There is no intracranial mass, hemorrhage, extra-axial fluid collection, or midline shift. There is slight small vessel disease in the centra semiovale bilaterally, stable. No acute infarct is demonstrable. Vascular: There is no hyperdense vessel. There are foci of calcification in the carotid siphon regions, more on the right than on the left. Skull: Bony calvarium appears intact. Sinuses/Orbits: There is mucosal thickening in several ethmoid air cells. There is mild mucosal thickening in the anterior superior right maxillary antrum. Orbits appear symmetric bilaterally. Other: Mastoid air cells are clear. IMPRESSION: Stable mild atrophy with slight periventricular small vessel disease. No mass or hemorrhage. No acute infarct. There are foci of arterial vascular calcification. There are areas of mild paranasal sinus disease. Electronically Signed   By: Bretta Bang III M.D.   On: 11/22/2018 13:40    Procedures Procedures (including critical care time)  Medications Ordered in ED Medications - No data to display   Initial Impression / Assessment and Plan / ED Course  I have reviewed the triage vital  signs and the nursing notes.  Pertinent labs & imaging results that were available during my care of the patient were reviewed by me and considered in my medical decision making (see chart for details).  Iv ns. Labs sent. Imaging ordered.   Reviewed nursing notes and prior charts for additional history.   Labs reviewed by me - chem normal. No uti. hgb is mildly decreased compared to prior - stool light brown, heme neg.   CT reviewed by me - no acute hem.  Ns bolus in ED. Trial of po fluids.   Ambulated in hall w assist.  Spouse  present, states current mental status c/w baseline - states was admitted to [facility] w similar general weakness, and is not markedly change since, remains generally weak. She states earlier this AM, around 3 AM, she went to check on him and mouth appeared twisted then.   Patient is already on anticoag therapy. Vital signs c/w baseline. Spouse indicates has home health/PT. Reviewed recent admission notes - appears they were recommended outpt neurology f/u then - will refer to neurology f/u.   Patient currently denies pain, denies new numbness/weakness. Afebrile. Pt currently appears stable for d/c. Continue home health/PT, rec close outpt neurology and pcp f/u.  Return precautions provided.       Final Clinical Impressions(s) / ED Diagnoses   Final diagnoses:  None    ED Discharge Orders    None           Cathren Laine, MD 11/22/18 1534

## 2019-01-17 ENCOUNTER — Other Ambulatory Visit: Payer: Self-pay | Admitting: Adult Health

## 2019-01-18 ENCOUNTER — Other Ambulatory Visit: Payer: Self-pay | Admitting: Adult Health

## 2019-03-11 ENCOUNTER — Other Ambulatory Visit: Payer: Self-pay

## 2019-03-11 ENCOUNTER — Emergency Department (HOSPITAL_COMMUNITY): Payer: Medicare Other

## 2019-03-11 ENCOUNTER — Emergency Department (HOSPITAL_COMMUNITY)
Admission: EM | Admit: 2019-03-11 | Discharge: 2019-03-11 | Disposition: A | Discharge status: Home / Self Care | Payer: Medicare Other | Attending: Emergency Medicine | Admitting: Emergency Medicine

## 2019-03-11 ENCOUNTER — Encounter (HOSPITAL_COMMUNITY): Payer: Self-pay

## 2019-03-11 DIAGNOSIS — M25552 Pain in left hip: Secondary | ICD-10-CM | POA: Insufficient documentation

## 2019-03-11 HISTORY — DX: Unspecified atrial fibrillation: I48.91

## 2019-03-11 HISTORY — DX: Unspecified dementia, unspecified severity, without behavioral disturbance, psychotic disturbance, mood disturbance, and anxiety: F03.90

## 2019-03-11 HISTORY — DX: Parkinson's disease: G20

## 2019-03-11 HISTORY — DX: Parkinson's disease without dyskinesia, without mention of fluctuations: G20.A1

## 2019-03-11 LAB — BASIC METABOLIC PANEL
Anion gap: 10 (ref 5–15)
BUN: 12 mg/dL (ref 8–23)
CO2: 27 mmol/L (ref 22–32)
Calcium: 9.3 mg/dL (ref 8.9–10.3)
Chloride: 103 mmol/L (ref 98–111)
Creatinine, Ser: 0.92 mg/dL (ref 0.61–1.24)
GFR calc Af Amer: >60 mL/min (ref >60)
GFR calc non Af Amer: >60 mL/min (ref >60)
Glucose, Bld: 101 mg/dL — ABNORMAL HIGH (ref 70–99)
Potassium: 4 mmol/L (ref 3.5–5.1)
Sodium: 140 mmol/L (ref 135–145)

## 2019-03-11 LAB — CBC WITH DIFFERENTIAL/PLATELET
Abs Immature Granulocytes: 0.05 10*3/uL (ref 0.00–0.07)
Basophils Absolute: 0 10*3/uL (ref 0.0–0.1)
Basophils Relative: 0 %
Eosinophils Absolute: 0.1 10*3/uL (ref 0.0–0.5)
Eosinophils Relative: 1 %
HCT: 39.5 % (ref 39.0–52.0)
Hemoglobin: 11.9 g/dL — ABNORMAL LOW (ref 13.0–17.0)
Immature Granulocytes: 1 %
Lymphocytes Relative: 15 %
Lymphs Abs: 1.7 10*3/uL (ref 0.7–4.0)
MCH: 24.4 pg — ABNORMAL LOW (ref 26.0–34.0)
MCHC: 30.1 g/dL (ref 30.0–36.0)
MCV: 81.1 fL (ref 80.0–100.0)
Monocytes Absolute: 1 10*3/uL (ref 0.1–1.0)
Monocytes Relative: 9 %
Neutro Abs: 8.2 10*3/uL — ABNORMAL HIGH (ref 1.7–7.7)
Neutrophils Relative %: 74 %
Platelets: 279 10*3/uL (ref 150–400)
RBC: 4.87 MIL/uL (ref 4.22–5.81)
RDW: 13.9 % (ref 11.5–15.5)
WBC: 11.1 10*3/uL — ABNORMAL HIGH (ref 4.0–10.5)
nRBC: 0 % (ref 0.0–0.2)

## 2019-03-11 MED ORDER — SODIUM CHLORIDE 0.9 % IV BOLUS
1000.0000 mL | Freq: Once | INTRAVENOUS | Status: AC
  Administered 2019-03-11: 1000 mL via INTRAVENOUS

## 2019-03-11 NOTE — ED Triage Notes (Signed)
Pt fell approx 2 months ago and complains of left hip pain and will holler out in pain recently.left hip started swelling yesterday. Pt has pressure ulcer to bottom, family was assuming pain was from sore. Pt went to wound care clinic yesterday . Pt has hx of dementia and parkinsons

## 2019-03-11 NOTE — Discharge Instructions (Addendum)
Take Tylenol for pain and follow-up with your primary doctor if not improving in the next few days.

## 2019-03-11 NOTE — ED Provider Notes (Signed)
[facility] EMERGENCY DEPARTMENT Provider Note   CSN: [id number] Arrival date & time: 03/11/19  1233     History Chief Complaint  Patient presents with  . Hip Pain    AURTHUR WINGERTER is a 79 y.o. male.  Patient brought to the emergency department for dementia and left hip discomfort. He also has Parkinson's disease. Patient has a decubitus that he saw wound care yesterday for on his sacrum.  The history is provided by a relative. No language interpreter was used.  Hip Pain This is a new problem. The current episode started 2 days ago. The problem occurs constantly. The problem has not changed since onset.Pertinent negatives include no chest pain. Nothing aggravates the symptoms. Nothing relieves the symptoms.       Past Medical History:  Diagnosis Date  . Atrial fibrillation (HCC)   . BPH (benign prostatic hyperplasia)   . Bradycardia   . Dementia (HCC)   . Hyperlipemia   . Hypertension   . Hypotension   . Memory impairment   . Parkinson disease [facility])     Patient Active Problem List   Diagnosis Date Noted  . Paroxysmal atrial fibrillation (HCC) 11/05/2018  . Hypothyroidism in adult 11/05/2018  . Essential hypertension, benign 11/05/2018  . Chronic non-seasonal allergic rhinitis 11/05/2018  . 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 Hypokalemia 11/05/2018  . BPH with urinary obstruction 11/05/2018  . Presence of cardiac pacemaker 11/05/2018  . Vitamin B 12 deficiency 11/05/2018  . AKI (acute kidney injury) (HCC)   . Atrial fibrillation, chronic (HCC)   . Alzheimer's dementia with behavioral disturbance (HCC)   . Generalized weakness 10/29/2018  . Physical deconditioning 10/29/2018  . Dementia without behavioral disturbance (HCC) 10/29/2018  . Parkinson disease (HCC) 10/29/2018  . Bradycardia 10/29/2018  . Debility 10/29/2018    Past Surgical History:  Procedure Laterality Date  . PACEMAKER PLACEMENT         Family History  Problem Relation  Age of Onset  . Diabetes Father   . Heart disease Father   . Heart disease Sister     Social History   Tobacco Use  . Smoking status: Never Smoker  . Smokeless tobacco: Never Used  Substance Use Topics  . Alcohol use: No  . Drug use: No    Home Medications Prior to Admission medications   Medication Sig Start Date End Date Taking? Authorizing Provider  apixaban (ELIQUIS) 5 MG TABS tablet Take 1 tablet (5 mg total) by mouth 2 (two) times daily. 11/18/18   Sharee Holster, NP  aspirin EC 81 MG tablet Take 81 mg by mouth daily.    [provider]  cyanocobalamin 1000 MCG tablet Take 1,000 mcg by mouth daily.    [provider]  donepezil (ARICEPT) 10 MG tablet Take 1 tablet (10 mg total) by mouth daily. 11/18/18   Sharee Holster, NP  fluticasone (FLONASE) 50 MCG/ACT nasal spray Place 1-2 sprays into the nose daily.    [provider]  levothyroxine (SYNTHROID) 50 MCG tablet Take 1 tablet (50 mcg total) by mouth daily. 11/18/18   Sharee Holster, NP  loratadine (CLARITIN) 10 MG tablet Take 10 mg by mouth daily.    [provider]  losartan (COZAAR) 100 MG tablet Take 1 tablet (100 mg total) by mouth daily. 11/18/18   Sharee Holster, NP  Memantine HCl-Donepezil HCl (NAMZARIC) 28-10 MG CP24 Take 1 capsule by mouth daily. 11/18/18   Sharee Holster, NP  NON FORMULARY Diet: Regular, NAS, Consistent Carbohydrate    [provider]  Potassium Chloride ER 20 MEQ TBCR Take 1 tablet by mouth daily. 11/18/18   Sharee Holster, NP  sertraline (ZOLOFT) 100 MG tablet Take 1 tablet (100 mg total) by mouth daily. 11/18/18   Sharee Holster, NP  silodosin (RAPAFLO) 8 MG CAPS capsule Take 1 capsule (8 mg total) by mouth daily. 11/18/18   Sharee Holster, NP    Allergies    Patient has no known allergies.  Review of Systems   Review of Systems  Unable to perform ROS: Mental status change  Cardiovascular: Negative for chest pain.    Physical  Exam Updated Vital Signs BP 127/81 (BP Location: Left Arm)   Pulse 65   Temp 97.8 F (36.6 C) (Oral)   Resp 16   Physical Exam Vitals and nursing note reviewed.  Constitutional:      Appearance: He is well-developed.  HENT:     Head: Normocephalic.     Nose: Nose normal.  Eyes:     General: No scleral icterus.    Conjunctiva/sclera: Conjunctivae normal.  Neck:     Thyroid: No thyromegaly.  Cardiovascular:     Rate and Rhythm: Normal rate and regular rhythm.     Heart sounds: No murmur. No friction rub. No gallop.   Pulmonary:     Breath sounds: No stridor. No wheezing or rales.  Chest:     Chest wall: No tenderness.  Abdominal:     General: There is no distension.     Tenderness: There is no abdominal tenderness. There is no rebound.  Musculoskeletal:        General: Normal range of motion.     Cervical back: Neck supple.     Comments: Small decubitus on sacrum no pain in left hip  Lymphadenopathy:     Cervical: No cervical adenopathy.  Skin:    Findings: No erythema or rash.  Neurological:     Motor: No abnormal muscle tone.     Coordination: Coordination normal.     ED Results / Procedures / Treatments   Labs (all labs ordered are listed, but only abnormal results are displayed) Labs Reviewed  CBC WITH DIFFERENTIAL/PLATELET  BASIC METABOLIC PANEL    EKG None  Radiology CT Hip Left Wo Contrast  Result Date: 03/11/2019 CLINICAL DATA:  Left hip pain since falling 2 months ago. Increased swelling yesterday. Pressure ulcer, dementia and Parkinson's disease. 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 EXAM: CT OF THE LEFT HIP WITHOUT CONTRAST TECHNIQUE: Multidetector CT imaging of the left hip was performed according to the standard protocol. Multiplanar CT image reconstructions were also generated. COMPARISON:  Abdominopelvic CT 10/19/2011. FINDINGS: Bones/Joint/Cartilage Examination is limited to the left hip and inferior left hemipelvis. No evidence of acute fracture, dislocation or  femoral head avascular necrosis. Stable mild left hip degenerative changes. No significant joint effusion. Ligaments Suboptimally assessed by CT. Muscles and Tendons Unremarkable. No fluid collection, foreign body or focal muscular atrophy identified. Soft tissues There is new soft tissue thickening around the distal sacrum and coccyx which is incompletely visualized, possibly related to the reported decubitus ulcer. No focal fluid collection, foreign body or bone destruction is apparent in this area. The coccyx appears grossly stable. There is lower lumbar spondylosis. The prostate gland is moderately enlarged. IMPRESSION: 1. No evidence of acute left hip fracture, dislocation or femoral head avascular necrosis. 2. New soft tissue thickening around the distal sacrum and coccyx, incompletely visualized,  possibly related to the reported decubitus ulcer. No focal fluid collection, foreign body or bone destruction identified. 3. Mild left hip degenerative changes. Electronically Signed   By: Richardean Sale M.D.   On: 03/11/2019 13:37    Procedures Procedures (including critical care time)  Medications Ordered in ED Medications  sodium chloride 0.9 % bolus 1,000 mL (1,000 mLs Intravenous New Bag/Given 03/11/19 1530)    ED Course  I have reviewed the triage vital signs and the nursing notes.  Pertinent labs & imaging results that were available during my care of the patient were reviewed by me and considered in my medical decision making (see chart for details).    MDM Rules/Calculators/A&P                      Patient's left hip is nontender. Family stated at discharge that the patient had not been eating or drinking enough and we will evaluate him with basic labs and give him some IV fluids. Patient has dementia sacral decubiti.    Labs unremarkable.  Patient was discharged by Dr. Stark Jock after IV fluids Final Clinical Impression(s) / ED Diagnoses Final diagnoses:  Left hip pain    Rx / DC  Orders ED Discharge Orders    None       Milton Ferguson, MD 03/14/19 [phone]

## 2019-03-11 NOTE — ED Provider Notes (Signed)
Care assumed from Dr. Estell Harpin at shift change.  Patient presenting here with complaints of pain in his hip.  His x-rays are negative.  He also has a pressure ulcer to his decubitus area which I am told appears well.   Care signed out to me awaiting results of laboratory studies and IV fluids as the wife was concerned he was not taking enough food or drink.  The studies are essentially unremarkable and IV fluids have been administered.  At this point, patient appears appropriate for discharge and follow-up with primary doctor.   Geoffery Lyons, MD 03/11/19 1659

## 2019-04-11 DEATH — deceased

## 2021-05-22 IMAGING — CT CT HIP*L* W/O CM
2 of 3 series · 17 of 46 positions shown, 19 images · non-contrast
Comparison: Abdominopelvic CT 10/19/2011.

CLINICAL DATA: Left hip pain since falling 2 months ago. Increased
swelling yesterday. Pressure ulcer, dementia and Parkinson's
disease. Concern for AVN.

EXAM:
CT OF THE LEFT HIP WITHOUT CONTRAST
TECHNIQUE: Multidetector CT imaging of the left hip was performed according to
the standard protocol. Multiplanar CT image reconstructions were
also generated.

[Series 5: axial st · axial · 0.46mm/px · z∈[+741,+931]mm · 14 of 109 slices shown, 16 images]
[im 7/109  soft-tissue]
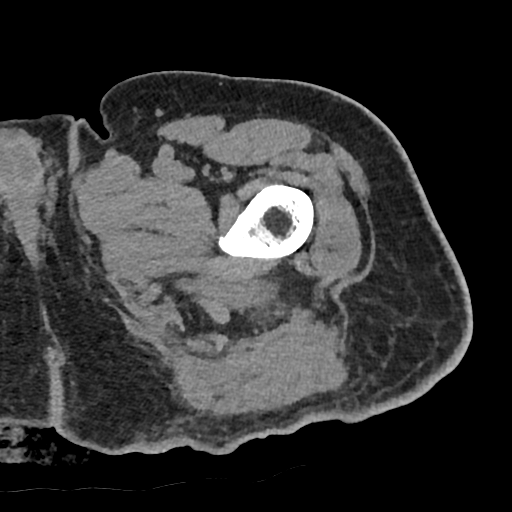
[im 7/109  bone]
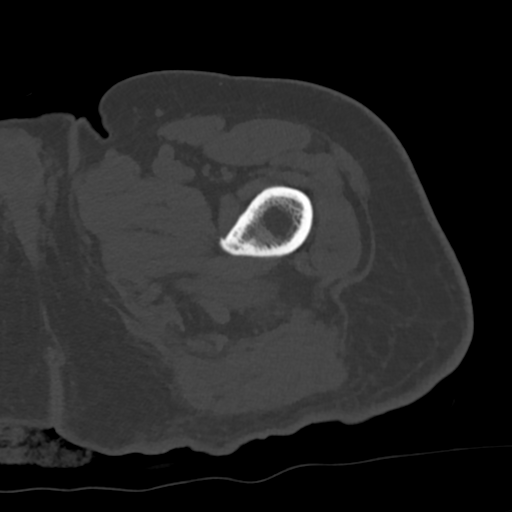
[im 14/109  soft-tissue]
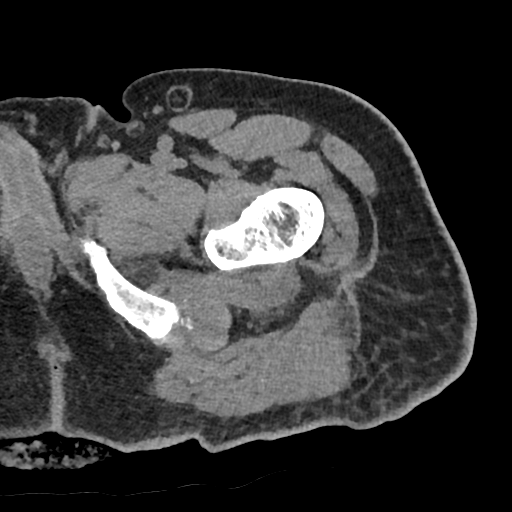
[im 21/109  soft-tissue]
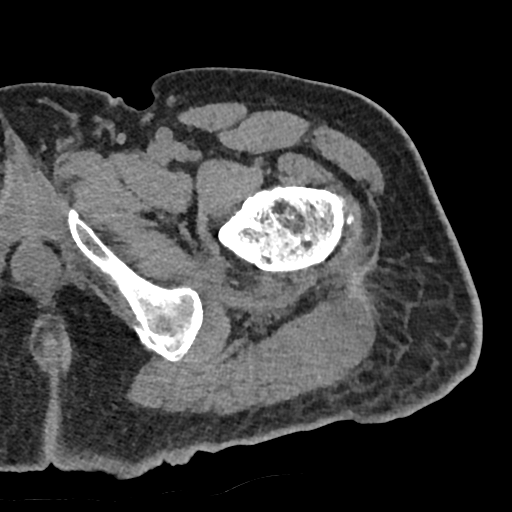
[im 28/109  soft-tissue]
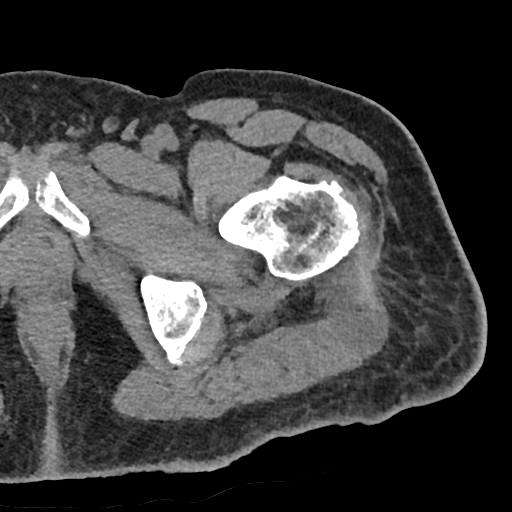
[im 35/109  soft-tissue]
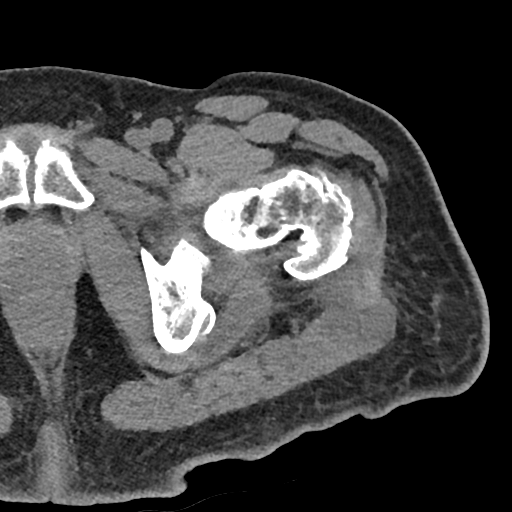
[im 42/109  soft-tissue]
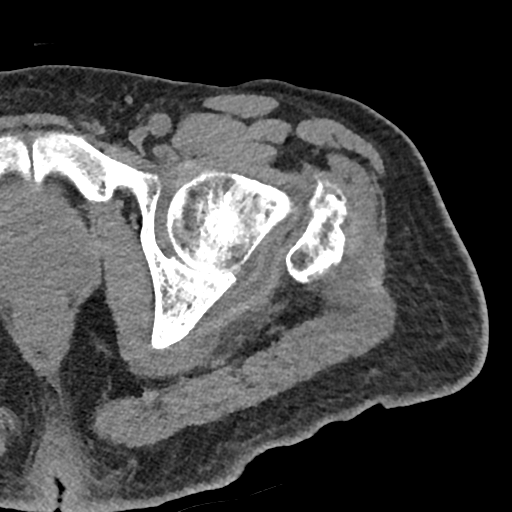
[im 49/109  soft-tissue]
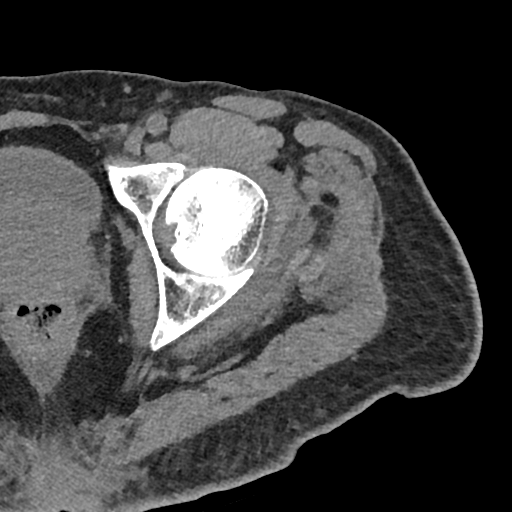
[im 60/109  soft-tissue]
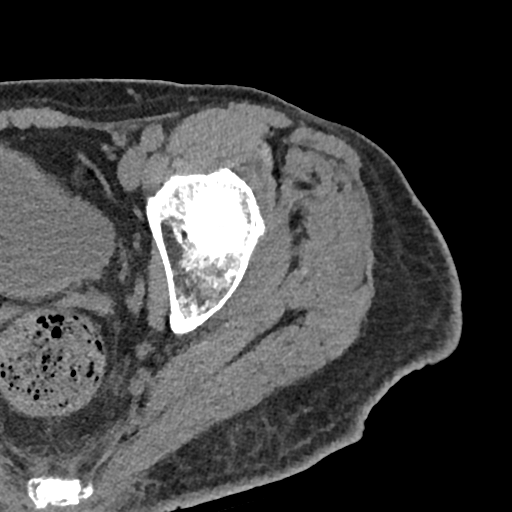
[im 67/109  soft-tissue]
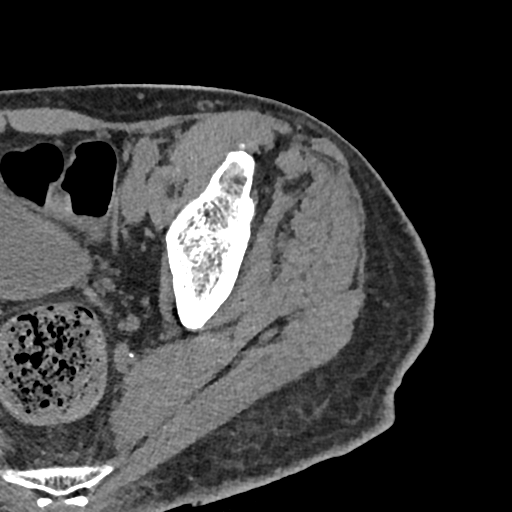
[im 67/109  bone]
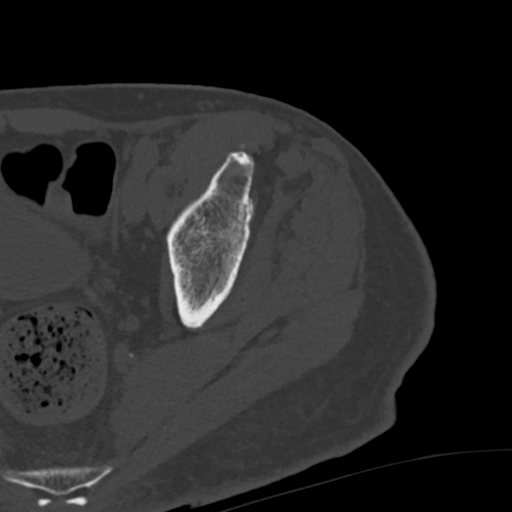
[im 74/109  soft-tissue]
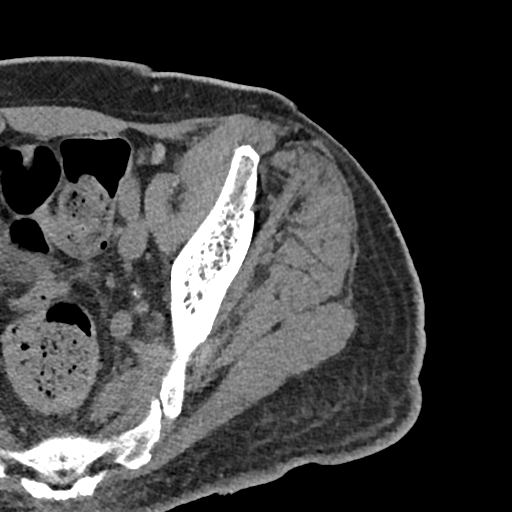
[im 81/109  soft-tissue]
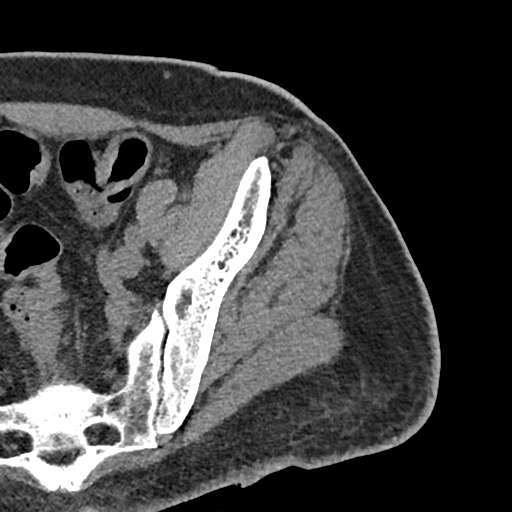
[im 88/109  soft-tissue]
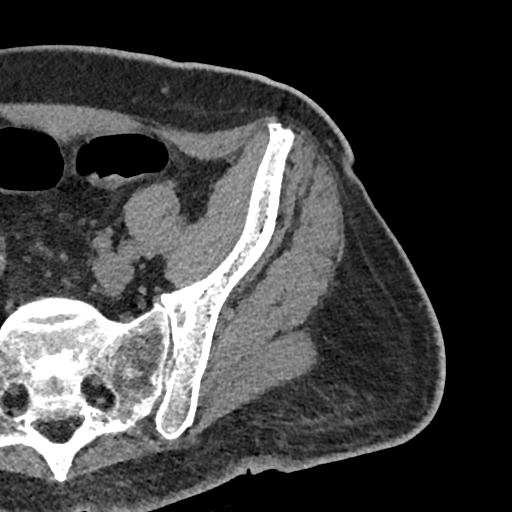
[im 95/109  soft-tissue]
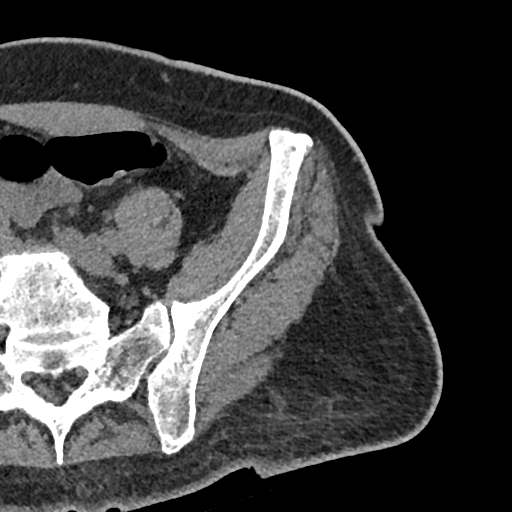
[im 102/109  soft-tissue]
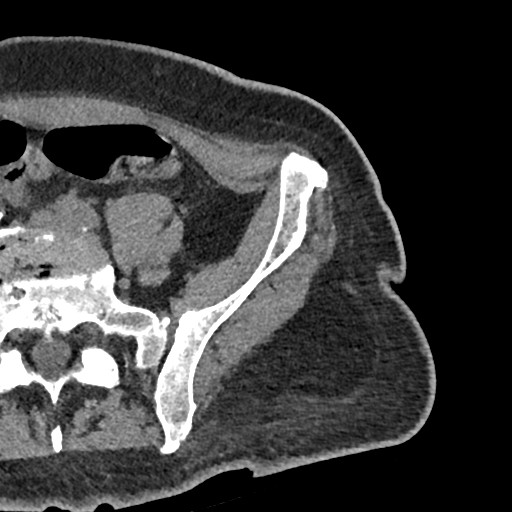

[Series 10: coronal st · coronal · 0.43mm/px · 3 of 92 slices shown]
[im 31/92  soft-tissue]
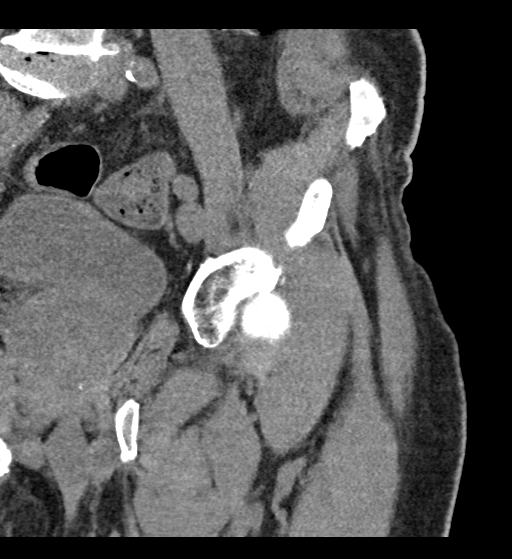
[im 41/92  soft-tissue]
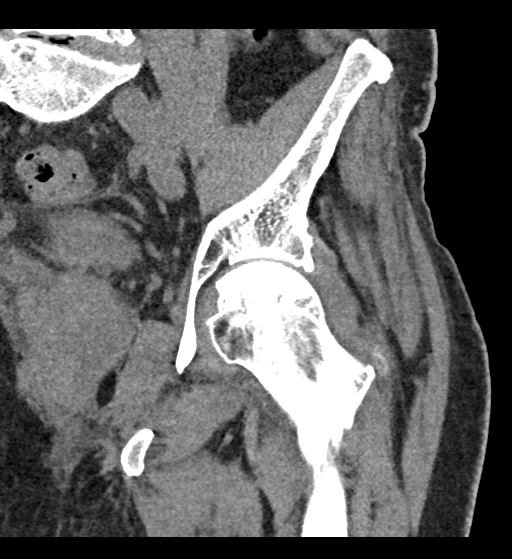
[im 51/92  soft-tissue]
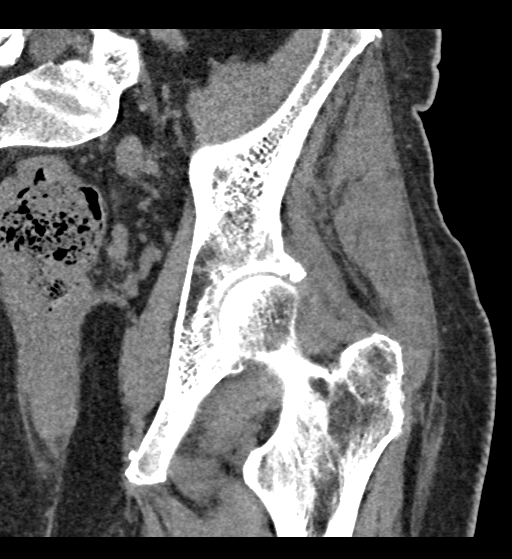

[17 of 46 positions shown; findings below may reference images not displayed]

FINDINGS: Bones/Joint/Cartilage

Examination is limited to the left hip and inferior left hemipelvis.
No evidence of acute fracture, dislocation or femoral head avascular
necrosis. Stable mild left hip degenerative changes. No significant
joint effusion.

Ligaments

Suboptimally assessed by CT.

Muscles and Tendons

Unremarkable. No fluid collection, foreign body or focal muscular
atrophy identified.

Soft tissues

There is new soft tissue thickening around the distal sacrum and
coccyx which is incompletely visualized, possibly related to the
reported decubitus ulcer. No focal fluid collection, foreign body or
bone destruction is apparent in this area. The coccyx appears
grossly stable. There is lower lumbar spondylosis. The prostate
gland is moderately enlarged.
IMPRESSION: 1. No evidence of acute left hip fracture, dislocation or femoral
head avascular necrosis.
2. New soft tissue thickening around the distal sacrum and coccyx,
incompletely visualized, possibly related to the reported decubitus
ulcer. No focal fluid collection, foreign body or bone destruction
identified.
3. Mild left hip degenerative changes.
